# Patient Record
Sex: Female | Born: 1995 | Race: Black or African American | Hispanic: No | Marital: Single | Smoking: Never smoker
Health system: Southern US, Community
[De-identification: ages and names within clinical notes are randomized; demographics above are authoritative.]

## PROBLEM LIST (undated history)

## (undated) DIAGNOSIS — Z8616 Personal history of COVID-19: Secondary | ICD-10-CM

## (undated) DIAGNOSIS — L732 Hidradenitis suppurativa: Secondary | ICD-10-CM

## (undated) DIAGNOSIS — F419 Anxiety disorder, unspecified: Secondary | ICD-10-CM

## (undated) HISTORY — DX: Personal history of COVID-19: Z86.16

## (undated) HISTORY — DX: Hidradenitis suppurativa: L73.2

## (undated) HISTORY — PX: CYST EXCISION: SHX5701

---
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
## 2014-11-24 ENCOUNTER — Encounter: Payer: Self-pay | Admitting: General Surgery

## 2014-11-24 ENCOUNTER — Ambulatory Visit (INDEPENDENT_AMBULATORY_CARE_PROVIDER_SITE_OTHER): Payer: 59 | Admitting: General Surgery

## 2014-11-24 VITALS — BP 120/74 | HR 84 | Resp 14 | Ht 65.0 in | Wt 120.0 lb

## 2014-11-24 DIAGNOSIS — L732 Hidradenitis suppurativa: Secondary | ICD-10-CM

## 2014-11-24 NOTE — Addendum Note (Signed)
Addended by: Kieth BrightlySANKAR, SEEPLAPUTHUR G on: 11/24/2014 07:06 PM   Modules accepted: Orders

## 2014-11-24 NOTE — Progress Notes (Signed)
Patient ID: Nancy Brown, female   DOB: 12/12/1995, 19 y.o.   MRN: [id number] Patient is [facility] 12/04/14. She will pre admit by phone on 11/25/14. Patient is aware of date and instructions.

## 2014-11-24 NOTE — Progress Notes (Signed)
Patient ID: Nancy Brown, female   DOB: 06/03/1996, 19 y.o.   MRN: [id number]  Chief Complaint  Patient presents with  . Other    hidradenitis both axilla    HPI Nancy Brown is a 19 y.o. female who presents for an evaluation of hidradenitis of both axilla. This has been an issue for approximately 2-3 years. This problem comes and goes. She has recently been on antibiotics for this.   HPI  History reviewed. No pertinent past medical history.  Past Surgical History  Procedure Laterality Date  . Cyst excision Bilateral     side of ears    History reviewed. No pertinent family history.  Social History History  Substance Use Topics  . Smoking status: Never Smoker   . Smokeless tobacco: Never Used  . Alcohol Use: No    No Known Allergies  No current outpatient prescriptions on file.   No current facility-administered medications for this visit.    Review of Systems Review of Systems  Constitutional: Negative.   Respiratory: Negative.   Cardiovascular: Negative.     Blood pressure 120/74, pulse 84, resp. rate 14, height 5\' 5"  (1.651 m), weight 120 lb (54.432 kg), last menstrual period 11/24/2014.  Physical Exam Physical Exam  Constitutional: She is oriented to person, place, and time. She appears well-developed and well-nourished.  Cardiovascular: Normal rate, regular rhythm and normal heart sounds.   No murmur heard. Pulmonary/Chest: Effort normal and breath sounds normal.  Lymphadenopathy:    She has no cervical adenopathy.    She has no axillary adenopathy.  Neurological: She is alert and oriented to person, place, and time.  Skin: Skin is warm and dry.  7 x 5 area in the right axilla healed and active hidradenitis  7 x 5 area in the left axilla healed and active hidradenitis.    Data Reviewed Notes from urgent care  Assessment    Bilateral axillary hidradenitis. Best treated by excision. Discussed in full with pt and her mother. They are agreeable..       Plan    Surgery to be scheduled.        SANKAR,SEEPLAPUTHUR G 11/24/2014, 3:43 PM

## 2014-11-24 NOTE — Patient Instructions (Addendum)
The patient is aware to call back for any questions or concerns. Hidradenitis Suppurativa, Sweat Gland Abscess Hidradenitis suppurativa is a long lasting (chronic), uncommon disease of the sweat glands. With this, boil-like lumps and scarring develop in the groin, some times under the arms (axillae), and under the breasts. It may also uncommonly occur behind the ears, in the crease of the buttocks, and around the genitals.  CAUSES  The cause is from a blocking of the sweat glands. They then become infected. It may cause drainage and odor. It is not contagious. So it cannot be given to someone else. It most often shows up in puberty (about 10 to 20 years of age). But it may happen much later. It is similar to acne which is a disease of the sweat glands. This condition is slightly more common in African-Americans and women. SYMPTOMS   Hidradenitis usually starts as one or more red, tender, swellings in the groin or under the arms (axilla).  Over a period of hours to days the lesions get larger. They often open to the skin surface, draining clear to yellow-colored fluid.  The infected area heals with scarring. DIAGNOSIS  Your caregiver makes this diagnosis by looking at you. Sometimes cultures (growing germs on plates in the lab) may be taken. This is to see what germ (bacterium) is causing the infection.  TREATMENT   Topical germ killing medicine applied to the skin (antibiotics) are the treatment of choice. Antibiotics taken by mouth (systemic) are sometimes needed when the condition is getting worse or is severe.  Avoid tight-fitting clothing which traps moisture in.  Dirt does not cause hidradenitis and it is not caused by poor hygiene.  Involved areas should be cleaned daily using an antibacterial soap. Some patients find that the liquid form of Lever 2000, applied to the involved areas as a lotion after bathing, can help reduce the odor related to this condition.  Sometimes surgery is  needed to drain infected areas or remove scarred tissue. Removal of large amounts of tissue is used only in severe cases.  Birth control pills may be helpful.  Oral retinoids (vitamin A derivatives) for 6 to 12 months which are effective for acne may also help this condition.  Weight loss will improve but not cure hidradenitis. It is made worse by being overweight. But the condition is not caused by being overweight.  This condition is more common in people who have had acne.  It may become worse under stress. There is no medical cure for hidradenitis. It can be controlled, but not cured. The condition usually continues for years with periods of getting worse and getting better (remission). Document Released: 05/23/2004 Document Revised: 01/01/2012 Document Reviewed: 01/09/2014 Exit[employer] Patient Information 2015 [employer]. This information is not intended to replace advice given to you by your health care provider. Make sure you discuss any questions you have with your health care provider.  

## 2014-12-04 ENCOUNTER — Ambulatory Visit: Payer: Self-pay | Admitting: General Surgery

## 2014-12-04 DIAGNOSIS — L732 Hidradenitis suppurativa: Secondary | ICD-10-CM

## 2014-12-04 HISTORY — PX: AXILLARY HIDRADENITIS EXCISION: SUR522

## 2014-12-08 ENCOUNTER — Encounter: Payer: Self-pay | Admitting: General Surgery

## 2014-12-08 ENCOUNTER — Ambulatory Visit: Payer: 59 | Admitting: General Surgery

## 2014-12-08 ENCOUNTER — Ambulatory Visit (INDEPENDENT_AMBULATORY_CARE_PROVIDER_SITE_OTHER): Payer: 59 | Admitting: General Surgery

## 2014-12-08 VITALS — BP 110/70 | HR 82 | Resp 12 | Ht 65.0 in | Wt 118.0 lb

## 2014-12-08 DIAGNOSIS — L732 Hidradenitis suppurativa: Secondary | ICD-10-CM

## 2014-12-08 NOTE — Progress Notes (Signed)
Here today for postoperative visit, bilateral excision hidradenitis both axilla. Drain sheet present.  Both incision are intact. There is a stitch present in right axilla area that will need to be removed.  Drainage is still moderate. Right tube was milked and a long string of clot removed.  Follow up Thursday. Continue drain sheet.

## 2014-12-08 NOTE — Patient Instructions (Signed)
Follow up Thursday. Continue drain sheet.

## 2014-12-09 ENCOUNTER — Encounter: Payer: Self-pay | Admitting: General Surgery

## 2014-12-10 ENCOUNTER — Encounter: Payer: Self-pay | Admitting: General Surgery

## 2014-12-10 ENCOUNTER — Ambulatory Visit (INDEPENDENT_AMBULATORY_CARE_PROVIDER_SITE_OTHER): Payer: 59 | Admitting: General Surgery

## 2014-12-10 VITALS — BP 120/70 | HR 90 | Resp 12 | Ht 65.0 in | Wt 114.0 lb

## 2014-12-10 DIAGNOSIS — L732 Hidradenitis suppurativa: Secondary | ICD-10-CM

## 2014-12-10 NOTE — Progress Notes (Signed)
Here today for postoperative visit, bilateral excision hidradenitis both axilla. Drain sheet present. Drainage has been less than 10ml per day last 2 days Incisions healing well. Drains removed. Follow up with nurse on Tuesday for one suture removal at T site of incision and MD in 2 weeks.

## 2014-12-10 NOTE — Patient Instructions (Addendum)
The patient is aware to call back for any questions or concerns. Nurse Tuesday and MD in 2 weeks

## 2014-12-11 ENCOUNTER — Encounter: Payer: Self-pay | Admitting: General Surgery

## 2014-12-14 ENCOUNTER — Telehealth: Payer: Self-pay | Admitting: General Surgery

## 2014-12-14 NOTE — Telephone Encounter (Signed)
The patient's mother called reporting that one of the suture lines from the recent axillary hidradenitis surgery had opened. She had redress the wound. She is scheduled for a wound evaluation in the morning. A prescription for Ultram 50 mg, #30 with the inscription one-2 by mouth every 4-6 age when necessary for pain with 1 refill was sent to the pharmacy. I'll arrange to look at the wound tomorrow with the nurse.

## 2014-12-15 ENCOUNTER — Ambulatory Visit (INDEPENDENT_AMBULATORY_CARE_PROVIDER_SITE_OTHER): Payer: 59 | Admitting: General Surgery

## 2014-12-15 DIAGNOSIS — T8130XA Disruption of wound, unspecified, initial encounter: Secondary | ICD-10-CM

## 2014-12-15 DIAGNOSIS — L732 Hidradenitis suppurativa: Secondary | ICD-10-CM

## 2014-12-15 NOTE — Progress Notes (Signed)
Patient ID: Nancy Brown, female   DOB: 01/19/1996, 19 y.o.   MRN: [id number]  Nancy patient underwent excision of bilateral axillary hidradenitis on 12/04/2014. Her mother called last night reporting Nancy right axillary wound had disrupted and there was a small amount of drainage with some blood. She dress Nancy wound and Nancy Brown was seen today for assessment.  Examination shows moderate disruption of Nancy wound. Clean margins. Monocryl sutures removed. No evidence of loculated purulence or erythema.  Wound care was reviewed with both Nancy patient and her mother. She will follow-up as scheduled next week with Dr. Evette CristalSankar.

## 2014-12-16 DIAGNOSIS — T8130XA Disruption of wound, unspecified, initial encounter: Secondary | ICD-10-CM | POA: Insufficient documentation

## 2014-12-24 ENCOUNTER — Ambulatory Visit (INDEPENDENT_AMBULATORY_CARE_PROVIDER_SITE_OTHER): Payer: 59 | Admitting: General Surgery

## 2014-12-24 ENCOUNTER — Encounter: Payer: Self-pay | Admitting: General Surgery

## 2014-12-24 VITALS — BP 112/78 | HR 80 | Resp 12 | Ht 65.0 in | Wt 117.0 lb

## 2014-12-24 DIAGNOSIS — L732 Hidradenitis suppurativa: Secondary | ICD-10-CM

## 2014-12-24 NOTE — Progress Notes (Signed)
Here today for postoperative visit, bilateral excision hidradenitis both axilla on 12-04-14. States she is getting along better but that the right axilla is still numb.  Left axilla incision is well healed. The right axilla with open clean wound at the junction of the transverse vertical incision. The rest of the wound is well healed.  Triple antibiotic ointment and gauze daily. Follow up in 3 weeks.

## 2014-12-24 NOTE — Patient Instructions (Signed)
Triple antibiotic ointment and gauze daily. The patient is aware to call back for any questions or concerns.

## 2015-01-13 ENCOUNTER — Ambulatory Visit (INDEPENDENT_AMBULATORY_CARE_PROVIDER_SITE_OTHER): Payer: 59 | Admitting: General Surgery

## 2015-01-13 ENCOUNTER — Encounter: Payer: Self-pay | Admitting: General Surgery

## 2015-01-13 VITALS — BP 132/82 | HR 82 | Resp 14 | Ht 65.0 in | Wt 115.0 lb

## 2015-01-13 DIAGNOSIS — L732 Hidradenitis suppurativa: Secondary | ICD-10-CM

## 2015-01-13 NOTE — Patient Instructions (Addendum)
May use coco butter, scar fade or mederma cream. Patient will follow up as needed.

## 2015-01-13 NOTE — Progress Notes (Signed)
Here today for postoperative visit, bilateral excision hidradenitis both axilla on 12-04-14. States she is getting along much better but that the right axilla is still a little numb.  2 cm long very superficial open area in the right axilla. Area looks clean. Should heal in 2 weeks.  Patient will follow up as needed.

## 2015-02-15 LAB — SURGICAL PATHOLOGY

## 2015-02-21 NOTE — Op Note (Signed)
PATIENT NAME:  Nancy Brown, Nancy Brown MR#:  [id number] DATE OF BIRTH:  12/20/1995  DATE OF PROCEDURE:  12/04/2014  PREOPERATIVE DIAGNOSIS: Bilateral axillary hidradenitis.   POSTOPERATIVE DIAGNOSIS: Bilateral axillary hidradenitis.   OPERATION PERFORMED: Excision of hidradenitis, bilateral.   SURGEON: Kathreen CosierS. G. Siaosi Alter, M.D.   ANESTHESIA: General.   COMPLICATIONS: None.   ESTIMATED BLOOD LOSS: About 75 mL.   DRAINS: Blake drain, one on each side.   DESCRIPTION OF PROCEDURE: The patient was put to sleep in the supine position on the operating room table. The left axilla was operated on first. This area was prepped and draped out as a sterile field. Timeout was performed. The area involved went in an anterior to posterior direction in the mid portion of the axilla and measured about 7 x 5 cm. An elliptical excision was then performed of this entire area which was containing both the active and healed hidradenitis. The skin incision was deepened into the subcutaneous tissue and subsequently the involved skin was then excised out along the subcutaneous plane completely. Hemostasis was obtained with cautery. The skin was then elevated on both sides with the use of skin hooks to lift up the skin and subcutaneous tissue and cautery was used to mobilize the skin on both sides. Following this the subcutaneous tissue was approximated with interrupted 2-0 Vicryl stitches. A Blake drain was positioned and brought out through a stab incision inferiorly and fastened to the skin with a nylon stitch. The skin was then closed with a running 3-0 Monocryl stitch and then covered with LiquiBand. A small dressing was placed around the drain site. Following this the drapes were removed and the right axilla was operated on. This was again prepped and draped out as a sterile field. The area of involvement was more irregular on this side with a component that was more transverse and a portion that was going laterally into the upper arm.  This area was marked and subsequently excised out completely similar to what was done on the left. After this was done, the skin was elevated. Skin and subcutaneous tissue were elevated all along the open wound to allow for easy approximation. The approximation was done in a T fashion with the portion from the arm closed in a vertical fashion and the portion in the axillary fold in a transverse fashion from the anterior-posterior direction. After a drain was placed, similar to the left side, the subcutaneous tissue was approximated with interrupted 2-0 Vicryl stitches, and the skin was then closed with subcuticular 3-0 Monocryl with a single nylon stitch placed at the corner of a T junction. The area was covered with LiquiBand and a dressing placed around the drain. The patient subsequently was extubated and returned to the recovery room in stable condition.  ____________________________ S.Wynona LunaG. Tere Mcconaughey, MD sgs:sb D: 12/04/2014 12:21:17 ET T: 12/04/2014 14:20:55 ET JOB#: [id number]  cc: S.G. Evette CristalSankar, MD, <Dictator> [facility] Wynona LunaG Lyllian Gause MD ELECTRONICALLY SIGNED 12/07/2014 9:22

## 2015-09-20 ENCOUNTER — Ambulatory Visit (INDEPENDENT_AMBULATORY_CARE_PROVIDER_SITE_OTHER): Payer: 59 | Admitting: Family Medicine

## 2015-09-20 ENCOUNTER — Encounter: Payer: Self-pay | Admitting: Family Medicine

## 2015-09-20 VITALS — BP 119/78 | HR 76 | Temp 98.1°F | Resp 16 | Ht 65.0 in | Wt 115.8 lb

## 2015-09-20 DIAGNOSIS — M25441 Effusion, right hand: Secondary | ICD-10-CM | POA: Diagnosis not present

## 2015-09-20 LAB — CBC WITH DIFFERENTIAL/PLATELET
Basophils Absolute: 0 10*3/uL (ref 0.0–0.2)
Basos: 1 %
EOS (ABSOLUTE): 0.1 10*3/uL (ref 0.0–0.4)
Eos: 2 %
Hematocrit: 39.4 % (ref 34.0–46.6)
Hemoglobin: 13.4 g/dL (ref 11.1–15.9)
IMMATURE GRANS (ABS): 0 10*3/uL (ref 0.0–0.1)
IMMATURE GRANULOCYTES: 0 %
LYMPHS: 47 %
Lymphocytes Absolute: 2 10*3/uL (ref 0.7–3.1)
MCH: 28.6 pg (ref 26.6–33.0)
MCHC: 34 g/dL (ref 31.5–35.7)
MCV: 84 fL (ref 79–97)
Monocytes Absolute: 0.4 10*3/uL (ref 0.1–0.9)
Monocytes: 8 %
NEUTROS PCT: 42 %
Neutrophils Absolute: 1.8 10*3/uL (ref 1.4–7.0)
Platelets: 272 10*3/uL (ref 150–379)
RBC: 4.68 x10E6/uL (ref 3.77–5.28)
RDW: 13.4 % (ref 12.3–15.4)
WBC: 4.3 10*3/uL (ref 3.4–10.8)

## 2015-09-20 LAB — SEDIMENTATION RATE: SED RATE: 12 mm/h (ref 0–32)

## 2015-09-20 MED ORDER — MELOXICAM 15 MG PO TABS: 15.0000 mg | ORAL_TABLET | Freq: Every day | ORAL | Status: DC

## 2015-09-20 NOTE — Progress Notes (Signed)
Subjective:    Patient ID: Nancy Brown, female    DOB: 26-Mar-1996, 19 y.o.   MRN: [id number]  HPI: [name] is a 19 y.o. female presenting on 09/20/2015 for Hand Pain   HPI  Pt presents for R hand pain and swelling. No injury to hand. Symptoms began a few days ago (Sunday).  Hand is numb at fingertips and swollen to the wrist.  No bruising. No fevers at home. She had a similar episode in the L arm 2 months ago. Pain and burning from L shoulder to fingertips with swelling in the forearm from elbow down. She was at urgent care- was given a taper dose of prednisone.  Symptoms resolved but then developed in the R hand.  Pt reports she can barely bend the hand. History is significant for bilateral hidradenitis removal with drains in Feb of 2016. She has not had issues with hands up until 2 mos ago.  Past Medical History  Diagnosis Date  . Hidradenitis axillaris     surgically removed in 2016.    No current outpatient prescriptions on file prior to visit.   No current facility-administered medications on file prior to visit.    Review of Systems  Constitutional: Negative for fever, chills and fatigue.  Respiratory: Negative for chest tightness, shortness of breath and wheezing.   Cardiovascular: Negative for chest pain, palpitations and leg swelling.  Musculoskeletal: Positive for joint swelling and arthralgias.  Skin: Negative for color change, rash and wound.  Neurological: Positive for numbness (tips for R fingers). Negative for dizziness and weakness.  Hematological: Negative for adenopathy. Does not bruise/bleed easily.   Per HPI unless specifically indicated above     Objective:    BP 119/78 mmHg  Pulse 76  Temp(Src) 98.1 F (36.7 C) (Oral)  Resp 16  Ht '5\' 5"'  (1.651 m)  Wt 115 lb 12.8 oz (52.527 kg)  BMI 19.27 kg/m2  LMP 09/18/2015  Wt Readings from Last 3 Encounters:  09/20/15 115 lb 12.8 oz (52.527 kg)  01/13/15 115 lb (52.164 kg)  12/24/14 117 lb (53.071 kg)    Physical Exam  Constitutional: She appears well-developed and well-nourished. No distress.  HENT:  Head: Normocephalic and atraumatic.  Cardiovascular: Normal rate and regular rhythm.  Exam reveals no gallop and no friction rub.   No murmur heard. Pulmonary/Chest: Effort normal and breath sounds normal. She has no wheezes. She has no rales.  Musculoskeletal:       Right shoulder: Normal. She exhibits no tenderness and no swelling.       Left shoulder: Normal.       Right upper arm: Normal. She exhibits no tenderness, no bony tenderness, no swelling and no edema.       Left upper arm: Normal.       Right hand: She exhibits tenderness and swelling. She exhibits normal two-point discrimination, normal capillary refill and no deformity. Normal sensation noted. Decreased strength (grip limited due to pain) noted.       Left hand: Normal.  Lymphadenopathy:    She has no axillary adenopathy.  Skin: She is not diaphoretic.   Results for orders placed or performed in visit on 12/04/14  Surgical pathology  Result Value Ref Range   SURGICAL PATHOLOGY      Surgical Procedure CASE: [id number] PATIENT: Warda Dalesandro Surgical Pathology Report     SPECIMEN SUBMITTED: A. Hidradenitis of left axilla B. Hidradenitis of right axilla  CLINICAL HISTORY: None provided  PRE-OPERATIVE DIAGNOSIS: Bilateral hidradenitis  of axilla's  POST-OPERATIVE DIAGNOSIS: Excision of bilateral hidradenitis     DIAGNOSIS: A.  SKIN AND SOFT TISSUE OF AXILLA, LEFT; EXCISION: - HIDRADENITIS SUPPURATIVA WITH SINUS TRACT AND ABSCESS FORMATION. - NO NEOPLASIA SEEN.  B.  SKIN AND SOFT TISSUE OF AXILLA, RIGHT; EXCISION: - HIDRADENITIS SUPPURATIVA WITH SINUS TRACT AND ABSCESS FORMATION. - NO NEOPLASIA SEEN.    GROSS DESCRIPTION: A. Labeled: Hidradenitis of left axilla Tissue Fragment(s): 1 Measurement: 7.5 x 3.5 x 1.5 cm Comment: Roughly elliptical piece of dark brown skin and soft tissue. There are  several defects that range from 0.2-0.5 cm in diameter, extends into the underlying soft tissue. Cut surface reveals a cystic tract meas uring 0.8 cm in diameter, containing red soft material  Representative sections submitted in cassette(s): 1  B. Labeled: Hidradenitis of right axilla Tissue Fragment(s): 1 Measurement: 7.3 x 4.4 x 1.2 cm Comment: Irregular shaped piece of dark brown skin and soft tissue. There are several defects that range from 0.2-0.5 cm in diameter, extends into the underlying soft tissue. The cut surface reveals a cystic spaces measuring up to 0.9 cm in diameter, containing tan soft material.  Representative sections submitted in cassette(s): 1      Final Diagnosis performed by Hassan Buckler, MD.  Electronically signed 12/07/2014 10:31:30AM    The electronic signature indicates that the named Attending Pathologist has evaluated the specimen  Technical component performed at [facility], [address] [place], [place] [address] Lab: [phone] Dir: Darrick Penna. Evette Doffing, MD  Professional component performed at [facility], [facility], [place],  [place], [place] [address] Lab: [phone] Dir: Dellia Nims. Reuel Derby, MD        Assessment & Plan:   Problem List Items Addressed This Visit    None    Visit Diagnoses    Swelling of joint of hand, right    -  Primary    Possible reprecussion from surgery.  Given pain and swelling- r/o inflammatory arthritis, lupus, or other autoimmune. CBC, CMP, ESR, ANA. Meloxicam for pain and swelling. Consider rheumatology referral. RTC 2 weeks.     Relevant Orders    CBC with Differential    Antinuclear Antib (ANA)    Sed Rate (ESR)    Rheumatoid Factor    Comprehensive metabolic panel       Meds ordered this encounter  Medications  . meloxicam (MOBIC) 15 MG tablet    Sig: Take 1 tablet (15 mg total) by mouth daily.    Dispense:  20 tablet    Refill:  1    Order Specific Question:   Supervising Provider    Answer:  Arlis Porta [[id number]]      Follow up plan: Return in about 2 weeks (around 10/04/2015).

## 2015-09-20 NOTE — Patient Instructions (Signed)
I have given a strong anti-inflammatory and pain medication. Do not take any addition ibuprofen, aleve, aspirin with this medication.  We will check your lab work to determine what is causing your symptoms.  If your hand goes numb, cold, gets red, or very painful, please seek immediate medical attention.

## 2015-09-21 LAB — COMPREHENSIVE METABOLIC PANEL
A/G RATIO: 1.3 (ref 1.1–2.5)
ALT: 9 IU/L (ref 0–32)
AST: 16 IU/L (ref 0–40)
Albumin: 4.6 g/dL (ref 3.5–5.5)
Alkaline Phosphatase: 53 IU/L (ref 39–117)
BILIRUBIN TOTAL: 0.4 mg/dL (ref 0.0–1.2)
BUN / CREAT RATIO: 13 (ref 8–20)
BUN: 8 mg/dL (ref 6–20)
CALCIUM: 10 mg/dL (ref 8.7–10.2)
CHLORIDE: 101 mmol/L (ref 97–106)
CO2: 20 mmol/L (ref 18–29)
Creatinine, Ser: 0.64 mg/dL (ref 0.57–1.00)
GFR, EST AFRICAN AMERICAN: 149 mL/min/{1.73_m2} (ref >59)
GFR, EST NON AFRICAN AMERICAN: 129 mL/min/{1.73_m2} (ref >59)
GLOBULIN, TOTAL: 3.6 g/dL (ref 1.5–4.5)
Glucose: 79 mg/dL (ref 65–99)
POTASSIUM: 4.2 mmol/L (ref 3.5–5.2)
SODIUM: 141 mmol/L (ref 136–144)
TOTAL PROTEIN: 8.2 g/dL (ref 6.0–8.5)

## 2015-09-21 LAB — ANA: ANA: POSITIVE — AB

## 2015-09-21 LAB — RHEUMATOID FACTOR: Rhuematoid fact SerPl-aCnc: <10 IU/mL (ref 0.0–13.9)

## 2015-09-22 ENCOUNTER — Telehealth: Payer: Self-pay | Admitting: Family Medicine

## 2015-09-22 DIAGNOSIS — R768 Other specified abnormal immunological findings in serum: Secondary | ICD-10-CM

## 2015-09-22 NOTE — Telephone Encounter (Signed)
Called pt to review her lab results. ANA was positive.  We will have her evaluated by rheumatology to determine the cause.

## 2015-10-05 ENCOUNTER — Telehealth: Payer: Self-pay | Admitting: Family Medicine

## 2015-10-05 NOTE — Telephone Encounter (Signed)
R/T call to patient and let her know she needs to [facility] to schedule her appt. They have left her 2 msgs.

## 2015-10-05 NOTE — Telephone Encounter (Signed)
[facility],  She asked if that was about her referral to rheumatology.  Please call [phone]

## 2015-10-21 DIAGNOSIS — R768 Other specified abnormal immunological findings in serum: Secondary | ICD-10-CM | POA: Insufficient documentation

## 2015-10-21 DIAGNOSIS — M79643 Pain in unspecified hand: Secondary | ICD-10-CM | POA: Insufficient documentation

## 2015-11-04 DIAGNOSIS — R768 Other specified abnormal immunological findings in serum: Secondary | ICD-10-CM | POA: Insufficient documentation

## 2016-03-10 ENCOUNTER — Other Ambulatory Visit
Admission: RE | Admit: 2016-03-10 | Discharge: 2016-03-10 | Disposition: A | Discharge status: Home / Self Care | Payer: 59 | Source: Ambulatory Visit | Attending: Family Medicine | Admitting: Family Medicine

## 2016-03-10 ENCOUNTER — Ambulatory Visit (INDEPENDENT_AMBULATORY_CARE_PROVIDER_SITE_OTHER): Payer: 59 | Admitting: Family Medicine

## 2016-03-10 ENCOUNTER — Encounter: Payer: Self-pay | Admitting: Family Medicine

## 2016-03-10 VITALS — BP 118/80 | HR 73 | Temp 98.1°F | Resp 16 | Ht 65.0 in | Wt 114.0 lb

## 2016-03-10 DIAGNOSIS — R0789 Other chest pain: Secondary | ICD-10-CM | POA: Diagnosis not present

## 2016-03-10 DIAGNOSIS — M79605 Pain in left leg: Secondary | ICD-10-CM

## 2016-03-10 DIAGNOSIS — R079 Chest pain, unspecified: Secondary | ICD-10-CM | POA: Diagnosis present

## 2016-03-10 DIAGNOSIS — M25562 Pain in left knee: Secondary | ICD-10-CM

## 2016-03-10 LAB — FIBRIN DERIVATIVES D-DIMER (ARMC ONLY): FIBRIN DERIVATIVES D-DIMER (ARMC): 231 (ref 0–499)

## 2016-03-10 MED ORDER — NAPROXEN 500 MG PO TABS: 500.0000 mg | ORAL_TABLET | Freq: Two times a day (BID) | ORAL | Status: DC

## 2016-03-10 NOTE — Progress Notes (Addendum)
Subjective:    Patient ID: Nancy Brown, female    DOB: 1996-05-03, 20 y.o.   MRN: [id number]  HPI: [name] is a 20 y.o. female presenting on 03/10/2016 for Leg Pain   HPI  Pt presents for leg pain- was sent home from work last week with chest pain, elevated BP and L leg pain. Sent home last Thursday. Did not go to ER. Took ibuprofen andpain went away.  Pain returned last night. Located above knee to calf on lateral side of anterior knee.  No chest pain or shortness of breath. Leg around the knee feels painful and tight. Most painful with flexion and full extension of the knee. No recent travel.  Only hurts when she walks on lays down. No running or injury to leg. No swelling.   Past Medical History  Diagnosis Date  . Hidradenitis axillaris     surgically removed in 2016.    No current outpatient prescriptions on file prior to visit.   No current facility-administered medications on file prior to visit.    Review of Systems  Constitutional: Negative for fever and chills.  HENT: Negative.   Respiratory: Negative for cough, chest tightness and wheezing.   Cardiovascular: Positive for chest pain. Negative for leg swelling.  Gastrointestinal: Negative for nausea, vomiting, abdominal pain, diarrhea and constipation.  Endocrine: Negative.  Negative for cold intolerance, heat intolerance, polydipsia, polyphagia and polyuria.  Genitourinary: Negative for dysuria and difficulty urinating.  Musculoskeletal: Positive for arthralgias (L knee). Negative for myalgias.  Neurological: Negative for dizziness, light-headedness and numbness.  Psychiatric/Behavioral: Negative.    Per HPI unless specifically indicated above     Objective:    BP 118/80 mmHg  Pulse 73  Temp(Src) 98.1 F (36.7 C) (Oral)  Resp 16  Ht '5\' 5"'  (1.651 m)  Wt 114 lb (51.71 kg)  BMI 18.97 kg/m2  LMP 10/24/2015  Wt Readings from Last 3 Encounters:  03/10/16 114 lb (51.71 kg)  09/20/15 115 lb 12.8 oz (52.527 kg)    01/13/15 115 lb (52.164 kg)    Physical Exam  Constitutional: She is oriented to person, place, and time. She appears well-developed and well-nourished.  HENT:  Head: Normocephalic and atraumatic.  Neck: Neck supple.  Cardiovascular: Normal rate, regular rhythm and normal heart sounds.  Exam reveals no gallop and no friction rub.   No murmur heard. Pulmonary/Chest: Effort normal and breath sounds normal. She has no wheezes. She exhibits no tenderness.  Abdominal: Soft. Normal appearance and bowel sounds are normal. She exhibits no distension and no mass. There is no tenderness. There is no rebound and no guarding.  Musculoskeletal: Normal range of motion. She exhibits no edema.       Left knee: She exhibits normal range of motion, no swelling, no effusion, no erythema, normal alignment, no LCL laxity, normal meniscus and no MCL laxity. Tenderness found. Lateral joint line tenderness noted.  Negative homan's sign bilateral. Calf circumference 13.5in bilateral.  Pain with flexion and full extension L knee. Pain along lateral joint line.   Lymphadenopathy:    She has no cervical adenopathy.  Neurological: She is alert and oriented to person, place, and time.  Skin: Skin is warm and dry.  Psychiatric: She has a normal mood and affect. Her behavior is normal. Judgment and thought content normal.   Results for orders placed or performed in visit on 09/20/15  CBC with Differential  Result Value Ref Range   WBC 4.3 3.4 - 10.8 x10E3/uL  RBC 4.68 3.77 - 5.28 x10E6/uL   Hemoglobin 13.4 11.1 - 15.9 g/dL   Hematocrit 39.4 34.0 - 46.6 %   MCV 84 79 - 97 fL   MCH 28.6 26.6 - 33.0 pg   MCHC 34.0 31.5 - 35.7 g/dL   RDW 13.4 12.3 - 15.4 %   Platelets 272 150 - 379 x10E3/uL   Neutrophils 42 %   Lymphs 47 %   Monocytes 8 %   Eos 2 %   Basos 1 %   Neutrophils Absolute 1.8 1.4 - 7.0 x10E3/uL   Lymphocytes Absolute 2.0 0.7 - 3.1 x10E3/uL   Monocytes Absolute 0.4 0.1 - 0.9 x10E3/uL   EOS  (ABSOLUTE) 0.1 0.0 - 0.4 x10E3/uL   Basophils Absolute 0.0 0.0 - 0.2 x10E3/uL   Immature Granulocytes 0 %   Immature Grans (Abs) 0.0 0.0 - 0.1 x10E3/uL  Antinuclear Antib (ANA)  Result Value Ref Range   Anit Nuclear Antibody(ANA) Positive (A) Negative  Sed Rate (ESR)  Result Value Ref Range   Sed Rate 12 0 - 32 mm/hr  Rheumatoid Factor  Result Value Ref Range   Rhuematoid fact SerPl-aCnc <10.0 0.0 - 13.9 IU/mL  Comprehensive metabolic panel  Result Value Ref Range   Glucose 79 65 - 99 mg/dL   BUN 8 6 - 20 mg/dL   Creatinine, Ser 0.64 0.57 - 1.00 mg/dL   GFR calc non Af Amer 129 >59 mL/min/1.73   GFR calc Af Amer 149 >59 mL/min/1.73   BUN/Creatinine Ratio 13 8 - 20   Sodium 141 136 - 144 mmol/L   Potassium 4.2 3.5 - 5.2 mmol/L   Chloride 101 97 - 106 mmol/L   CO2 20 18 - 29 mmol/L   Calcium 10.0 8.7 - 10.2 mg/dL   Total Protein 8.2 6.0 - 8.5 g/dL   Albumin 4.6 3.5 - 5.5 g/dL   Globulin, Total 3.6 1.5 - 4.5 g/dL   Albumin/Globulin Ratio 1.3 1.1 - 2.5   Bilirubin Total 0.4 0.0 - 1.2 mg/dL   Alkaline Phosphatase 53 39 - 117 IU/L   AST 16 0 - 40 IU/L   ALT 9 0 - 32 IU/L      Assessment & Plan:   Problem List Items Addressed This Visit    None    Visit Diagnoses    Lateral knee pain, left    -  Primary    Leg pain is likely MSK due to anterior knee pain. Trial of naproxen BID. Consider PT or ortho if symptoms not improved.     Relevant Medications    naproxen (NAPROSYN) 500 MG tablet    Left leg pain        Pain is on the anterior surface of the L. D dimer negative- rule out clot. No swelling. Likely referred from knee pain. Alarm symptoms reviewed.    Relevant Orders    Fibrin derivatives D-Dimer ([facility] only)    Chest pressure        ECG WNL. D dimer negative- low suspicion VTE. No current chest pain. Will monitor. Pt to ER if chest pain returns for immediate evaluation. Suspect anxiety related to leg pain and elevated BP.     Relevant Orders    EKG 12-Lead    Fibrin  derivatives D-Dimer ([facility] only)       Meds ordered this encounter  Medications  . MedroxyPROGESTERone Acetate 150 MG/ML SUSY    Sig:   . naproxen (NAPROSYN) 500 MG tablet    Sig: Take  1 tablet (500 mg total) by mouth 2 (two) times daily with a meal.    Dispense:  30 tablet    Refill:  1    Order Specific Question:  Supervising Provider    Answer:  Arlis Porta [[id number]]      Follow up plan: Return in about 2 weeks (around 03/24/2016) for knee pain.

## 2016-03-10 NOTE — Patient Instructions (Addendum)
L knee pain- Try taking naproxen twice daily to help with your knee. I think the majority of your pain is knee related. We will get some labwork to rule out a clot in the leg. If it is positive we will do an ultrasound.   I think your chest pressure may have been anxiety related. However- if you experience chest pain, shortness of breath, leg swelling or increased pain in the calf, please go to the ER.

## 2016-03-24 ENCOUNTER — Ambulatory Visit: Payer: 59 | Admitting: Family Medicine

## 2016-11-27 ENCOUNTER — Encounter: Payer: Self-pay | Admitting: Family Medicine

## 2016-11-27 ENCOUNTER — Ambulatory Visit (INDEPENDENT_AMBULATORY_CARE_PROVIDER_SITE_OTHER): Payer: 59 | Admitting: Family Medicine

## 2016-11-27 VITALS — BP 121/78 | HR 86 | Temp 99.6°F | Resp 16 | Ht 65.0 in | Wt 128.0 lb

## 2016-11-27 DIAGNOSIS — A084 Viral intestinal infection, unspecified: Secondary | ICD-10-CM

## 2016-11-27 MED ORDER — ONDANSETRON 4 MG PO TBDP: 4.0000 mg | ORAL_TABLET | Freq: Three times a day (TID) | ORAL | 0 refills | Status: DC | PRN

## 2016-11-27 NOTE — Patient Instructions (Signed)
Thank you for coming in to clinic today.  1.  I think this is a Viral Gastroenteritis - stomach bug. This will pass within 1 week, maybe less. - Important to continue hydration, ginger ale is fine, otherwise may start adding some Pedialyte or G2 Gatorade or Apple Juice mixed with water (equal parts, 1:1 mixture). Sometimes small sips is better than larger amounts at once, can try tablespoon every few minutes. - Given rx for Zofran ODT 4mg  dissolving tablets - may use every 8 hours or 3 times a day as needed for vomiting  If symptoms are worsening, persistent fevers >101F >3 days, abdominal pain, worsening or increased vomiting, persistent diarrhea after 5-7 days, decreased appetite, please call or return to clinic, may go to Urgent Care or Pediatic ED  Please schedule a follow-up appointment with Dr. Althea CharonKaramalegos in 1-2 weeks if not improved viral gastro  If you have any other questions or concerns, please feel free to call the clinic or send a message through MyChart. You may also schedule an earlier appointment if necessary.  Saralyn PilarAlexander Foy Mungia, DO [facility] [place]

## 2016-11-27 NOTE — Progress Notes (Signed)
Subjective:    Patient ID: Nancy Brown, female    DOB: 02-Aug-1996, 21 y.o.   MRN: [id number]  Nancy Brown is a 21 y.o. female presenting on 11/27/2016 for Diarrhea (nuaea and HA onset yesterday )  Patient presents for a same day appointment.  HPI   SUSPECETD VIRAL GASTROENTERITIS / NAUSEA/VOMITING / DIARRHEA Reports symptoms started around 5pm yesterday with diarrhea (loose stools but not watery) x 3 yesterday and x 1 today, then worsening to nausea and vomiting x1 (non bloody and non bilious) that evening, and did have repeat episode of diarrhea this morning. Today feels nauseas constantly but somewhat improved, tolerating some fluids with ginger ale, but food seems to make her more nauseas. 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 Smoking status: Never Smoker  . Smokeless tobacco: Never Used  . Alcohol use No    Review of Systems Per HPI unless specifically indicated above     Objective:    BP 121/78   Pulse 86   Temp 99.6 F (37.6 C) (Oral)   Resp 16   Ht 5\' 5"  (1.651 m)   Wt 128 lb (58.1 kg)   BMI 21.30 kg/m   Wt Readings from Last 3 Encounters:  11/27/16 128 lb (58.1 kg)  03/10/16 114 lb (51.7 kg)  09/20/15 115 lb 12.8 oz (52.5 kg)    Physical Exam  Constitutional: She appears well-developed and well-nourished. No distress.  Well-appearing, comfortable, cooperative  HENT:  Head: Normocephalic and atraumatic.  Mouth/Throat: Oropharynx is clear and moist.  Neck: Normal range of motion. Neck supple.    Cardiovascular: Normal rate.   Pulmonary/Chest: Effort normal and breath sounds normal. No respiratory distress. She has no wheezes. She has no rales.  Abdominal: Soft. Bowel sounds are normal. She exhibits no distension and no mass. There is no tenderness. There is no rebound and no guarding.  Negative Mcburney's RLQ.  Musculoskeletal: She exhibits no edema.  Neurological: She is alert.  Skin: Skin is warm and dry. No rash noted. She is not diaphoretic. No erythema.  Psychiatric: Her behavior is normal.  Nursing note and vitals reviewed.      Assessment & Plan:   Problem List Items Addressed This Visit    None    Visit Diagnoses    Viral gastroenteritis    -  Primary  Consistent with viral gastroenteritis, x 24 hour symptoms diarrhea / vomiting. Known +sick contact with mother same symptoms, no clear etiology of food poisoning, given symptoms onset before shared meal. No other recent illness symptoms - Currently well appearing and non-toxic, clinically well hydrated on exam, benign abdomen - On Depo provera contraception, no missed doses, has regular spotting, denies any sexual activity and declines offered pregnancy test  Plan: 1. Reassurance, likely self-limited 7-10 days 2. Continue PO as tolerated. May try to increase hydration, clear fluids (gatorade, pedialyate, water + apple juice) 3. Rx Zofran 4mg  ODT q 8 hr PRN for nausea 4. May try OTC Pepto-bismol / Imodium if needed 5. Return criteria reviewed  Note for  work out today, return Thursday (as regularly scheduled), already off Tues/Weds     Relevant Medications   ondansetron (ZOFRAN ODT) 4 MG disintegrating tablet      Meds ordered this encounter  Medications  . ondansetron (ZOFRAN ODT) 4 MG disintegrating tablet    Sig: Take 1 tablet (4 mg total) by mouth every 8 (eight) hours as needed for nausea or vomiting.    Dispense:  20 tablet    Refill:  0      Follow up plan: Return in about 2 weeks (around  12/11/2016), or if symptoms worsen or fail to improve, for viral gastroenteritis.  Saralyn PilarAlexander Karamalegos, DO [facility] 11/27/2016, 3:02 PM

## 2018-03-08 ENCOUNTER — Encounter: Payer: Self-pay | Admitting: Family Medicine

## 2018-03-08 ENCOUNTER — Ambulatory Visit (INDEPENDENT_AMBULATORY_CARE_PROVIDER_SITE_OTHER): Payer: 59 | Admitting: Family Medicine

## 2018-03-08 VITALS — BP 132/87 | HR 99 | Temp 99.1°F | Resp 16 | Ht 65.0 in | Wt 133.6 lb

## 2018-03-08 DIAGNOSIS — R519 Headache, unspecified: Secondary | ICD-10-CM

## 2018-03-08 DIAGNOSIS — R51 Headache: Secondary | ICD-10-CM | POA: Diagnosis not present

## 2018-03-08 DIAGNOSIS — R030 Elevated blood-pressure reading, without diagnosis of hypertension: Secondary | ICD-10-CM

## 2018-03-08 DIAGNOSIS — R55 Syncope and collapse: Secondary | ICD-10-CM | POA: Diagnosis not present

## 2018-03-08 NOTE — Progress Notes (Signed)
Subjective:    Patient ID: Nancy Brown, female    DOB: Mar 09, 1996, 22 y.o.   MRN: [id number]  Nancy Brown is a 22 y.o. female presenting on 03/08/2018 for Back Pain (HA when checked B/P this morning at work site it was 150/94 and pulse 118, felt Dizzy and passing out this morning)  Patient presents for a same day appointment. Her last visit to our office was over 1 year ago for acute GI illness.  HPI   ACUTE HEADACHE / ELEVATED BP / NEAR SYNCOPE Reports symptoms started this morning, she felt fine initially this morning, and then after got to work, she felt she had "pounding in head" and headache and then her "back was also hearing", and she felt "pressure in both legs", she states BP was usually fine and never elevated and she had acutely elevated BP 150/87 and pulse 118 and felt palpitations or heart racing,  - She felt dizzy and sat down on a resident's bed, and felt like she was going to pass out but didn't, had near syncope episode - Feels heaviness in both legs, without numbness or tingling or weakness - Admits she felt normal this morning and ate breakfast did not miss any meals, was not dehydrated - She has never had headaches or migraine before. Her mother has history of migraine headaches in past - Denies acute chest pain or tightness, dyspnea, cough, sinus pain or pressure congestion, ear pain, rash, neck stiffness, nausea vomiting abdominal pain, heavy menstrual cycle or bleeding (on Depo Provera), head injury or trauma  Additional PMH - >3 years ago she was seen by Dr Corey Harold Rheumatology due to elevated ANA lab, otherwise she had hand pain and swelling intermittent, was non specific work up at that time, thought to have a synovitis, but thought to have FALSE positive ANA.   Depression screen PHQ 2/9 09/20/2015  Decreased Interest 0  Down, Depressed, Hopeless 0  PHQ - 2 Score 0    Social History   Tobacco Use  . Smoking status: Never Smoker  . Smokeless  tobacco: Never Used  Substance Use Topics  . Alcohol use: No  . Drug use: No    Review of Systems Per HPI unless specifically indicated above     Objective:    BP 132/87   Pulse 99   Temp 99.1 F (37.3 C) (Oral)   Resp 16   Ht  (1.651 m)   Wt 133 lb 9.6 oz (60.6 kg)   SpO2 100%   BMI 22.23 kg/m   Wt Readings from Last 3 Encounters:  03/08/18 133 lb 9.6 oz (60.6 kg)  11/27/16 128 lb (58.1 kg)  03/10/16 114 lb (51.7 kg)    Physical Exam  Constitutional: She is oriented to person, place, and time. She appears well-developed and well-nourished. No distress.  Not well appearing, slightly uncomfortable with headache, cooperative  HENT:  Head: Normocephalic and atraumatic.  Mouth/Throat: Oropharynx is clear and moist.  Frontal / maxillary sinuses non-tender. Nares patent without purulence or edema. Bilateral TMs clear without erythema, effusion or bulging. Oropharynx clear without erythema, exudates, edema or asymmetry.  Photosensitivity on exam  Eyes: Conjunctivae are normal. Right eye exhibits no discharge. Left eye exhibits no discharge.  Neck: Normal range of motion. Neck supple.  Cardiovascular: Regular rhythm, normal heart sounds and intact distal pulses.  No murmur heard. Tachycardic  Pulmonary/Chest: Effort normal and breath sounds normal. No respiratory distress. She has no wheezes. She has no rales.  Musculoskeletal: Normal range of motion. She exhibits no edema.  Muscle strength intact 5/5 b/l extremities  Calves and lower leg appear symmetrical, mild tender lower leg R>L, but no erythema or edema.  Lymphadenopathy:    She has no cervical adenopathy.  Neurological: She is alert and oriented to person, place, and time. No cranial nerve deficit or sensory deficit. She exhibits normal muscle tone. Coordination normal.  Distal intact sensation  Skin: Skin is warm and dry. No rash noted. She is not diaphoretic. No erythema.  Psychiatric: She has a normal mood  and affect. Her behavior is normal.  Well groomed, good eye contact, normal speech and thoughts. Not anxious appearing.  Nursing note and vitals reviewed.      Assessment & Plan:   Problem List Items Addressed This Visit    None    Visit Diagnoses    Acute nonintractable headache, unspecified headache type    -  Primary   Near syncope       Elevated BP without diagnosis of hypertension          Still acutely ill today with elevated BP, somewhat improved in office, and tachycardia but also improved. 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, however question if this could be related - Also on Depo Provera without heavy periods, but question if any lab abnormality anemia or electrolyte disturbance could be present - Most likely differential is acute migraine, given her symptoms and persistent headache Does not appear dehydrated, and she is not hemodynamically unstable  Plan Discussion on differential diagnosis is not clear - if concern for acute migraine, do not feel comfortable with abortive therapy at this time given all of her other symptoms at time of onset - especially leading into weekend with limited follow-up until next week - Recommend she needs lab work up more promptly to rule out other causes of her symptoms - May benefit from other testing possible EKG and imaging. Prior history of False ANA - may need repeat rheum labs or possibly rule out DVT/PE - given her lower extremity symptoms but exam is  not actually consistent with DVT - Well's Score is 0 - Agree to go to [facility] ED - called hospital triage reviewed case, she will go by Personal Vehicle w/ Father driving her right now - Follow-up next week as planned  No orders of the defined types were placed in this encounter.   Follow up plan: Return in about 1 week (around 03/15/2018), or if symptoms worsen or fail to improve.  Saralyn Pilar, DO [facility] 03/08/2018, 10:21 AM

## 2018-03-08 NOTE — Patient Instructions (Addendum)
Thank you for coming to the office today.  Please go straight to [facility] Emergency Department - I will call them now to let them know of your arrival  Need blood tests and other work up - may have migraine headache or elevated BP for other reason, need to check chemistry and make sure not anemic  Please schedule a Follow-up Appointment to: Return in about 1 week (around 03/15/2018), or if symptoms worsen or fail to improve.  If you have any other questions or concerns, please feel free to call the office or send a message through MyChart. You may also schedule an earlier appointment if necessary.  Additionally, you may be receiving a survey about your experience at our office within a few days to 1 week by e-mail or mail. We value your feedback.  Saralyn Pilar, DO [facility], [place]

## 2018-09-18 ENCOUNTER — Emergency Department
Admission: EM | Admit: 2018-09-18 | Discharge: 2018-09-18 | Disposition: A | Discharge status: Home / Self Care | Payer: BLUE CROSS/BLUE SHIELD | Attending: Emergency Medicine | Admitting: Emergency Medicine

## 2018-09-18 ENCOUNTER — Emergency Department: Payer: BLUE CROSS/BLUE SHIELD

## 2018-09-18 ENCOUNTER — Other Ambulatory Visit: Payer: Self-pay

## 2018-09-18 DIAGNOSIS — R0789 Other chest pain: Secondary | ICD-10-CM

## 2018-09-18 LAB — CBC
HCT: 37.4 % (ref 36.0–46.0)
HEMOGLOBIN: 12.2 g/dL (ref 12.0–15.0)
MCH: 28 pg (ref 26.0–34.0)
MCHC: 32.6 g/dL (ref 30.0–36.0)
MCV: 85.8 fL (ref 80.0–100.0)
NRBC: 0 % (ref 0.0–0.2)
Platelets: 269 10*3/uL (ref 150–400)
RBC: 4.36 MIL/uL (ref 3.87–5.11)
RDW: 12.9 % (ref 11.5–15.5)
WBC: 4.7 10*3/uL (ref 4.0–10.5)

## 2018-09-18 LAB — COMPREHENSIVE METABOLIC PANEL
ALBUMIN: 4.1 g/dL (ref 3.5–5.0)
ALK PHOS: 45 U/L (ref 38–126)
ALT: 12 U/L (ref 0–44)
AST: 20 U/L (ref 15–41)
Anion gap: 8 (ref 5–15)
BILIRUBIN TOTAL: 0.6 mg/dL (ref 0.3–1.2)
BUN: 16 mg/dL (ref 6–20)
CALCIUM: 9.1 mg/dL (ref 8.9–10.3)
CO2: 26 mmol/L (ref 22–32)
CREATININE: 0.7 mg/dL (ref 0.44–1.00)
Chloride: 107 mmol/L (ref 98–111)
GFR calc Af Amer: >60 mL/min (ref >60)
GFR calc non Af Amer: >60 mL/min (ref >60)
GLUCOSE: 105 mg/dL — AB (ref 70–99)
Potassium: 3.3 mmol/L — ABNORMAL LOW (ref 3.5–5.1)
SODIUM: 141 mmol/L (ref 135–145)
TOTAL PROTEIN: 7.8 g/dL (ref 6.5–8.1)

## 2018-09-18 LAB — TROPONIN I: Troponin I: <0.03 ng/mL (ref <0.03)

## 2018-09-18 LAB — FIBRIN DERIVATIVES D-DIMER (ARMC ONLY): Fibrin derivatives D-dimer (ARMC): 181.85 ng/mL (FEU) (ref 0.00–499.00)

## 2018-09-18 MED ORDER — IBUPROFEN 600 MG PO TABS: 600.0000 mg | ORAL_TABLET | Freq: Four times a day (QID) | ORAL | 0 refills | Status: DC | PRN

## 2018-09-18 NOTE — ED Notes (Signed)
Patient verbalized understanding of discharge instructions, no questions. Patient ambulated out of ED with steady gait in no distress.  

## 2018-09-18 NOTE — ED Provider Notes (Signed)
[facility] Emergency Department Provider Note  Time seen: 3:53 PM  I have reviewed the triage vital signs and the nursing notes.   HISTORY  Chief Complaint Chest Pain and Shortness of Breath    HPI Nancy Brown is a 22 y.o. female with no significant past medical history who presents to the emergency department for chest pain.  According to the patient she was at work when she began experiencing chest pain a little over an hour ago.  Patient states the pain is worse with movement of her chest or deep inspiration.  States she is only had chest pain one time previously and that was due to a "panic attack."  Patient denies any nausea or diaphoresis although states she was feeling somewhat short of breath when the chest pain started.  Describes the pain as mild to moderate currently, worse with movement or deep inspiration.  Aching type pain.  No leg pain, patient does state intermittent leg swelling but that is a chronic issue no significant swelling currently.  No personal history of DVT/blood clot.  Patient is on Depo-Provera.   Past Medical History:  Diagnosis Date  . Hidradenitis axillaris    surgically removed in 2016.    Patient Active Problem List   Diagnosis Date Noted  . 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 ANA positive 10/21/2015  . Hand discomfort 10/21/2015  . Disruption of wound 12/16/2014    Past Surgical History:  Procedure Laterality Date  . AXILLARY HIDRADENITIS EXCISION Bilateral 12-04-14  . CYST EXCISION Bilateral    side of ears    Prior to Admission medications   Medication Sig Start Date End Date Taking? Authorizing Provider  MedroxyPROGESTERone Acetate 150 MG/ML SUSY  01/30/16   [provider]    No Known Allergies  Family History  Problem Relation Age of Onset  . Hypertension Mother   . Asthma Mother   . Thyroid disease Mother   . Hyperlipidemia Mother     Social History Social History   Tobacco  Use  . Smoking status: Never Smoker  . Smokeless tobacco: Never Used  Substance Use Topics  . Alcohol use: No  . Drug use: No    Review of Systems Constitutional: Negative for fever. Cardiovascular: Positive for central chest pain Respiratory: Shortness of breath, now resolved. Gastrointestinal: Negative for abdominal pain, vomiting  Genitourinary: Unknown last menstrual period, irregular per patient. Musculoskeletal: Negative for leg pain. Skin: Negative for skin complaints  Neurological: Negative for headache All other ROS negative  ____________________________________________   PHYSICAL EXAM:  VITAL SIGNS: ED Triage Vitals [09/18/18 1546]  Enc Vitals Group     BP 127/85     Pulse Rate (!) 105     Resp 17     Temp (!) 97.4 F (36.3 C)     Temp Source Oral     SpO2 97 %     Weight      Height      Head Circumference      Peak Flow      Pain Score      Pain Loc      Pain Edu?      Excl. in GC?    Constitutional: Alert and oriented. Well appearing and in no distress. Eyes: Normal exam ENT   Head: Normocephalic and atraumatic.   Mouth/Throat: Mucous membranes are moist. Cardiovascular: Normal rate, regular rhythm around 100 bpm.  No murmurs. Respiratory: Normal respiratory effort without tachypnea nor retractions. Breath sounds  are clear  Gastrointestinal: Soft and nontender. No distention.   Musculoskeletal: Nontender with normal range of motion in all extremities. No lower extremity tenderness or edema.  Sternum is very tender to palpation. Neurologic:  Normal speech and language. No gross focal neurologic deficits  Skin:  Skin is warm, dry and intact.  Psychiatric: Mood and affect are normal.   ____________________________________________    EKG  EKG reviewed and interpreted by myself shows a normal sinus rhythm at 96 bpm with a narrow QRS, normal axis, normal intervals, nonspecific ST changes.  ____________________________________________     RADIOLOGY  Chest x-ray is negative  ____________________________________________   INITIAL IMPRESSION / ASSESSMENT AND PLAN / ED COURSE  Pertinent labs & imaging results that were available during my care of the patient were reviewed by me and considered in my medical decision making (see chart for details).  Patient presents to the emergency department for chest pain beginning approximately 1 hour ago, somewhat worse with movement and deep inspiration.  Chest pain is extremely reproducible on exam with chest palpation.  Given her pleuritic description however we will obtain a d-dimer as well as basic lab work including a troponin,  EKG and chest x-ray.  Differential this time would include chest wall pain, ACS, PE, pneumonia, pneumothorax.  Patient's work-up is essentially negative.  Negative troponin.  Negative d-dimer.  Negative chest x-ray, no concerning findings on EKG.  Overall the patient appears extremely well with a very reproducible chest pain.  Highly suspect chest wall pain to be the patient source of discomfort.  We will discharged with ibuprofen medication PCP follow-up in my normal chest pain return precautions.  Patient agreeable to plan of care.  ____________________________________________   FINAL CLINICAL IMPRESSION(S) / ED DIAGNOSES  Chest wall pain    Minna Antis, MD 09/18/18 1655

## 2018-09-18 NOTE — ED Triage Notes (Signed)
Patient arrives with c/o chest pain that started 1 hour ago. Patient states pain started at rest and is on the right sided radiating to neck. Hx anxiety, stated seen in ED in 2010 with similar episode. +sob. EMS administered 324 ASA. 20 g inserted into left ac.

## 2019-10-06 ENCOUNTER — Other Ambulatory Visit: Payer: Self-pay

## 2019-10-16 ENCOUNTER — Encounter: Payer: 59 | Admitting: Obstetrics & Gynecology

## 2019-12-25 ENCOUNTER — Telehealth: Payer: Self-pay | Admitting: Family Medicine

## 2019-12-25 NOTE — Telephone Encounter (Signed)
Pt wanted to know if you would write a letter for mask . That she was allergic to mask. Pt call back# is  [phone]

## 2019-12-25 NOTE — Telephone Encounter (Signed)
Called patient. Spoke to her, new letter written for her, it is printed and signed, in my outbox, ready to be faxed to her work at  TO: [facility] ATTN Riley Kill Fax: [phone]  Saralyn Pilar, DO [facility] 12/25/2019, 12:42 PM

## 2020-05-10 ENCOUNTER — Other Ambulatory Visit: Payer: Self-pay

## 2020-05-10 ENCOUNTER — Emergency Department: Payer: 59

## 2020-05-10 ENCOUNTER — Emergency Department
Admission: EM | Admit: 2020-05-10 | Discharge: 2020-05-10 | Disposition: A | Discharge status: Home / Self Care | Payer: 59 | Attending: Emergency Medicine | Admitting: Emergency Medicine

## 2020-05-10 DIAGNOSIS — R197 Diarrhea, unspecified: Secondary | ICD-10-CM | POA: Diagnosis not present

## 2020-05-10 DIAGNOSIS — R109 Unspecified abdominal pain: Secondary | ICD-10-CM

## 2020-05-10 DIAGNOSIS — K5900 Constipation, unspecified: Secondary | ICD-10-CM

## 2020-05-10 DIAGNOSIS — R1031 Right lower quadrant pain: Secondary | ICD-10-CM | POA: Diagnosis present

## 2020-05-10 LAB — URINALYSIS, COMPLETE (UACMP) WITH MICROSCOPIC
Bilirubin Urine: NEGATIVE
Glucose, UA: NEGATIVE mg/dL
Hgb urine dipstick: NEGATIVE
Ketones, ur: NEGATIVE mg/dL
Nitrite: NEGATIVE
Protein, ur: NEGATIVE mg/dL
Specific Gravity, Urine: 1.013 (ref 1.005–1.030)
pH: 6 (ref 5.0–8.0)

## 2020-05-10 LAB — COMPREHENSIVE METABOLIC PANEL
ALT: 18 U/L (ref 0–44)
AST: 31 U/L (ref 15–41)
Albumin: 4.1 g/dL (ref 3.5–5.0)
Alkaline Phosphatase: 45 U/L (ref 38–126)
Anion gap: 6 (ref 5–15)
BUN: 8 mg/dL (ref 6–20)
CO2: 28 mmol/L (ref 22–32)
Calcium: 9.1 mg/dL (ref 8.9–10.3)
Chloride: 102 mmol/L (ref 98–111)
Creatinine, Ser: 0.7 mg/dL (ref 0.44–1.00)
GFR calc Af Amer: >60 mL/min (ref >60)
GFR calc non Af Amer: >60 mL/min (ref >60)
Glucose, Bld: 94 mg/dL (ref 70–99)
Potassium: 3.6 mmol/L (ref 3.5–5.1)
Sodium: 136 mmol/L (ref 135–145)
Total Bilirubin: 1 mg/dL (ref 0.3–1.2)
Total Protein: 8.2 g/dL — ABNORMAL HIGH (ref 6.5–8.1)

## 2020-05-10 LAB — CBC
HCT: 42 % (ref 36.0–46.0)
Hemoglobin: 13.7 g/dL (ref 12.0–15.0)
MCH: 28.8 pg (ref 26.0–34.0)
MCHC: 32.6 g/dL (ref 30.0–36.0)
MCV: 88.2 fL (ref 80.0–100.0)
Platelets: 252 10*3/uL (ref 150–400)
RBC: 4.76 MIL/uL (ref 3.87–5.11)
RDW: 13 % (ref 11.5–15.5)
WBC: 6.2 10*3/uL (ref 4.0–10.5)
nRBC: 0 % (ref 0.0–0.2)

## 2020-05-10 LAB — POCT PREGNANCY, URINE: Preg Test, Ur: NEGATIVE

## 2020-05-10 LAB — LIPASE, BLOOD: Lipase: 29 U/L (ref 11–51)

## 2020-05-10 MED ORDER — SODIUM CHLORIDE 0.9% FLUSH: 3.0000 mL | Freq: Once | INTRAVENOUS | Status: DC

## 2020-05-10 MED ORDER — POLYETHYLENE GLYCOL 3350 17 GM/SCOOP PO POWD: 17.0000 g | Freq: Every day | ORAL | 0 refills | Status: DC | PRN

## 2020-05-10 MED ORDER — DOCUSATE SODIUM 100 MG PO CAPS: 100.0000 mg | ORAL_CAPSULE | Freq: Two times a day (BID) | ORAL | 0 refills | Status: DC

## 2020-05-10 NOTE — ED Notes (Signed)
Pt taken to CT at this time.

## 2020-05-10 NOTE — ED Triage Notes (Signed)
Pt comes via EMS with c/o abdominal pain. EMs reports VSS

## 2020-05-10 NOTE — ED Notes (Signed)
Pt. POC was NEGATIVE. 

## 2020-05-10 NOTE — ED Provider Notes (Signed)
[facility] Emergency Department Provider Note  Time seen: 8:41 PM  I have reviewed the triage vital signs and the nursing notes.   HISTORY  Chief Complaint Abdominal Pain   HPI Nancy Brown is a 24 y.o. female with no significant past medical history presents to the emergency department for abdominal pain.  According to the patient  for the past 3 days she has been experiencing loose stool and some lower abdominal pain mostly in the right lower quadrant.  Patient states she is also late on her menstrual cycle and was not sure if she is pregnant.  Patient denies any vomiting.  Denies dysuria or hematuria.  Denies vaginal bleeding or discharge.  Patient does state moderate lower abdominal pain mostly right lower quadrant which has since diminished significantly now currently mild.  Past Medical History:  Diagnosis Date  . Hidradenitis axillaris    surgically removed in 2016.    Patient Active Problem List   Diagnosis Date Noted  . 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 ANA positive 10/21/2015  . Hand discomfort 10/21/2015  . Disruption of wound 12/16/2014    Past Surgical History:  Procedure Laterality Date  . AXILLARY HIDRADENITIS EXCISION Bilateral 12-04-14  . CYST EXCISION Bilateral    side of ears    Prior to Admission medications   Medication Sig Start Date End Date Taking? Authorizing Provider  ibuprofen (ADVIL,MOTRIN) 600 MG tablet Take 1 tablet (600 mg total) by mouth every 6 (six) hours as needed. 09/18/18   Minna Antis, MD  MedroxyPROGESTERone Acetate 150 MG/ML SUSY  01/30/16   [provider]    No Known Allergies  Family History  Problem Relation Age of Onset  . Hypertension Mother   . Asthma Mother   . Thyroid disease Mother   . Hyperlipidemia Mother     Social History Social History   Tobacco Use  . Smoking status: Never Smoker  . Smokeless tobacco: Never Used  Substance Use Topics  . Alcohol use:  No  . Drug use: No    Review of Systems Constitutional: Negative for fever Cardiovascular: Negative for chest pain. Respiratory: Negative for shortness of breath. Gastrointestinal: Lower and right lower quadrant abdominal pain.  Positive for diarrhea. Genitourinary: Negative for urinary compaints Musculoskeletal: Negative for musculoskeletal complaints Neurological: Negative for headache All other ROS negative  ____________________________________________   PHYSICAL EXAM:  VITAL SIGNS: ED Triage Vitals  Enc Vitals Group     BP 05/10/20 1909 128/84     Pulse Rate 05/10/20 1909 82     Resp 05/10/20 1909 17     Temp 05/10/20 1909 99.6 F (37.6 C)     Temp Source 05/10/20 1909 Oral     SpO2 05/10/20 1909 100 %     Weight 05/10/20 1852 140 lb (63.5 kg)     Height 05/10/20 1852 5\' 3"  (1.6 m)     Head Circumference --      Peak Flow --      Pain Score 05/10/20 1852 6     Pain Loc --      Pain Edu? --      Excl. in GC? --     Constitutional: Alert and oriented. Well appearing and in no distress. Eyes: Normal exam ENT      Head: Normocephalic and atraumatic.      Mouth/Throat: Mucous membranes are moist. Cardiovascular: Normal rate, regular rhythm. Respiratory: Normal respiratory effort without tachypnea nor retractions. Breath sounds are clear Gastrointestinal: Soft,  mild diffuse tenderness without focal significant tenderness identified.  No rebound guarding or distention. Musculoskeletal: Nontender with normal range of motion in all extremities.  Neurologic:  Normal speech and language. No gross focal neurologic deficits  Skin:  Skin is warm, dry and intact.  Psychiatric: Mood and affect are normal.   ____________________________________________   RADIOLOGY  CT scan is essentially negative.  ____________________________________________   INITIAL IMPRESSION / ASSESSMENT AND PLAN / ED COURSE  Pertinent labs & imaging results that were available during my care  of the patient were reviewed by me and considered in my medical decision making (see chart for details).   Patient presents emergency department for lower and right lower quadrant abdominal discomfort along with diarrhea for the past 3 days.  Overall the patient appears well, does have mild diffuse abdominal tenderness but no area of significant focal tenderness identified.  Patient's lab work is reassuring including normal white blood cell count, normal LFTs and largely normal urinalysis.  Pregnancy test is negative.  Given the patient's mostly right-sided abdominal pain we will proceed with a CT scan to further evaluate.  Patient agreeable to plan of care.  CT essentially negative there is free fluid in the pelvis however patient once again denies any vaginal discharge or risk for STDs.  I have added on a GC/chlamydia to the patient's urine.  On my review of the CT scan patient appears to have significant amount of constipation which could be contributing to her discomfort.  Reassuringly the rest of her labs are normal.  We will place the patient on MiraLAX and Colace, I discussed very strict return precautions for return of/worsening abdominal pain or development of fever.  Patient agreeable plan of care.   Nancy Brown was evaluated in Emergency Department on 05/10/2020 for the symptoms described in the history of present illness. She was evaluated in the context of the global COVID-19 pandemic, which necessitated consideration that the patient might be at risk for infection with the SARS-CoV-2 virus that causes COVID-19. Institutional protocols and algorithms that pertain to the evaluation of patients at risk for COVID-19 are in a state of rapid change based on information released by regulatory bodies including the CDC and federal and state organizations. These policies and algorithms were followed during the patient's care in the ED.  ____________________________________________   FINAL CLINICAL  IMPRESSION(S) / ED DIAGNOSES  Abdominal pain Constipation   Minna Antis, MD 05/10/20 2112

## 2020-05-10 NOTE — ED Notes (Signed)
Pt back in room at this time.

## 2020-05-10 NOTE — ED Notes (Signed)
Pt states she has burning sensation to lower abd started today. Has had diarrhea x4days, no difficulty urinating.

## 2020-05-10 NOTE — ED Triage Notes (Signed)
Pt comes via [facility] with c/o abdominal pain that started just a few minutes ago. Pt states diarrhea for 3 days. Pt states lower belly pain.  Pt unsure if pregnant. Pt states last period lasted only 4 days and was irregular.

## 2020-09-29 ENCOUNTER — Ambulatory Visit: Payer: 59 | Admitting: Advanced Practice Midwife

## 2020-09-29 ENCOUNTER — Other Ambulatory Visit (HOSPITAL_COMMUNITY)
Admission: RE | Admit: 2020-09-29 | Discharge: 2020-09-29 | Disposition: A | Discharge status: Home / Self Care | Payer: 59 | Source: Ambulatory Visit | Attending: Advanced Practice Midwife | Admitting: Advanced Practice Midwife

## 2020-09-29 ENCOUNTER — Other Ambulatory Visit: Payer: Self-pay

## 2020-09-29 ENCOUNTER — Encounter: Payer: Self-pay | Admitting: Advanced Practice Midwife

## 2020-09-29 VITALS — BP 135/79 | Ht 65.0 in | Wt 145.0 lb

## 2020-09-29 DIAGNOSIS — Z124 Encounter for screening for malignant neoplasm of cervix: Secondary | ICD-10-CM | POA: Insufficient documentation

## 2020-09-29 DIAGNOSIS — Z113 Encounter for screening for infections with a predominantly sexual mode of transmission: Secondary | ICD-10-CM | POA: Diagnosis present

## 2020-09-29 DIAGNOSIS — Z3400 Encounter for supervision of normal first pregnancy, unspecified trimester: Secondary | ICD-10-CM | POA: Diagnosis not present

## 2020-09-29 DIAGNOSIS — Z1379 Encounter for other screening for genetic and chromosomal anomalies: Secondary | ICD-10-CM

## 2020-09-29 DIAGNOSIS — Z3402 Encounter for supervision of normal first pregnancy, second trimester: Secondary | ICD-10-CM | POA: Insufficient documentation

## 2020-09-29 LAB — POCT URINALYSIS DIPSTICK OB
Glucose, UA: NEGATIVE
POC,PROTEIN,UA: NEGATIVE

## 2020-09-29 LAB — POCT URINE PREGNANCY: Preg Test, Ur: POSITIVE — AB

## 2020-09-29 NOTE — Progress Notes (Signed)
New Obstetric Patient H&P    Chief Complaint: "Desires prenatal care"   History of Present Illness: Patient is a 24 y.o. G1P0 Not Hispanic or Latino female, presents with amenorrhea and positive home pregnancy test. Patient's last menstrual period was 06/13/2020. and based on her  LMP, her EDD is Estimated Date of Delivery: 03/20/21 and her EGA is [id number]. Cycles are 7 days, regular, and occur approximately every : 28 days. Her last pap smear was 3 or 4 years ago and was no abnormalities.    She had a urine pregnancy test which was positive 2 or 3 month(s)  ago. Her last menstrual period was shorter and lasted for  4 or 5 day(s). Since her LMP she claims she has experienced breast tenderness, fatigue, nausea. She had several days of spotting in September and October. Her past medical history is noncontributory. This is her first pregnancy.  Since her LMP, she admits to the use of tobacco products  no She claims she has gained  a few pounds since the start of her pregnancy.  There are cats in the home in the home  no  She admits close contact with children on a regular basis  no  She has had chicken pox in the past no She has had Tuberculosis exposures, symptoms, or previously tested positive for TB   no Current or past history of domestic violence. no  Genetic Screening/Teratology Counseling: (Includes patient, baby's father, or anyone in either family with:)   1. [facility]  no 2. Thalassemia ([place], [place], Mediterranean, or Asian background): MCV<80  no 3. Neural tube defect (meningomyelocele, spina bifida, anencephaly)  no 4. Congenital heart defect  no  5. Down syndrome  no 6. Tay-Sachs (Jewish, [place])  no 7. Canavan's Disease  no 8. Sickle cell disease or trait (African)  no  9. Hemophilia or other blood disorders  no  10. Muscular dystrophy  no  11. Cystic fibrosis  no  12. Huntington's Chorea  no  13. Mental retardation/autism  no 14. Other  inherited genetic or chromosomal disorder  no 15. Maternal metabolic disorder (DM, PKU, etc)  no 16. Patient or FOB with a child with a birth defect not listed above no  16a. Patient or FOB with a birth defect themselves no 17. Recurrent pregnancy loss, or stillbirth  no  18. Any medications since LMP other than prenatal vitamins (include vitamins, supplements, OTC meds, drugs, alcohol)  no 19. Any other genetic/environmental exposure to discuss  no  Infection History:   1. Lives with someone with TB or TB exposed  no  2. Patient or partner has history of genital herpes  no 3. Rash or viral illness since LMP  no 4. History of STI (GC, CT, HPV, syphilis, HIV)  no 5. 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    Review of Systems:10 point review of systems negative unless otherwise noted in HPI  Past Medical History:  Patient Active Problem List   Diagnosis Date Noted  . Supervision of normal first pregnancy 09/29/2020    Clinic [facility] Prenatal Labs  Dating  Blood type:     Genetic Screen 1 Screen:    AFP:     Quad:     NIPS: Antibody:   Anatomic US  Rubella:    Varicella: @VZVIGG @  GTT Early: NA  Third trimester:  RPR:     Rhogam  HBsAg:     Vaccines TDAP:                       Flu Shot: Covid: HIV:     Baby Food                                GBS:   GC/CT:  Contraception  Pap: 09/29/20  CBB     CS/VBAC NA   Support Person Adam       . 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 ANA positive 10/21/2015  . Hand discomfort 10/21/2015  . Disruption of wound 12/16/2014    Past Surgical History:  Past Surgical History:  Procedure Laterality Date  . AXILLARY HIDRADENITIS EXCISION Bilateral 12-04-14  . CYST EXCISION Bilateral    side of ears    Gynecologic History: Patient's last menstrual period was 06/13/2020.  Obstetric History: G1P0  Family History:  Family History   Problem Relation Age of Onset  . Hypertension Mother   . Asthma Mother   . Thyroid disease Mother   . Hyperlipidemia Mother     Social History:  Social History   Socioeconomic History  . Marital status: Single    Spouse name: Not on file  . Number of children: Not on file  . Years of education: Not on file  . Highest education level: Not on file  Occupational History  . Not on file  Tobacco Use  . Smoking status: Never Smoker  . Smokeless tobacco: Never Used  Substance and Sexual Activity  . Alcohol use: No  . Drug use: No  . Sexual activity: Not on file  Other Topics Concern  . Not on file  Social History Narrative  . Not on file   Social Determinants of Health   Financial Resource Strain:   . Difficulty of Paying Living Expenses: Not on file  Food Insecurity:   . Worried About Programme researcher, broadcasting/film/video in the Last Year: Not on file  . Ran Out of Food in the Last Year: Not on file  Transportation Needs:   . Lack of Transportation (Medical): Not on file  . Lack of Transportation (Non-Medical): Not on file  Physical Activity:   . Days of Exercise per Week: Not on file  . Minutes of Exercise per Session: Not on file  Stress:   . Feeling of Stress : Not on file  Social Connections:   . Frequency of Communication with Friends and Family: Not on file  . Frequency of Social Gatherings with Friends and Family: Not on file  . Attends Religious Services: Not on file  . Active Member of Clubs or Organizations: Not on file  . Attends Banker Meetings: Not on file  . Marital Status: Not on file  Intimate Partner Violence:   . Fear of Current or Ex-Partner: Not on file  . Emotionally Abused: Not on file  . Physically Abused: Not on file  . Sexually Abused: Not on file    Allergies:  No Known Allergies  Medications: Prior to Admission medications   Medication Sig Start Date End Date Taking? Authorizing Provider  ibuprofen (ADVIL,MOTRIN) 600 MG tablet Take 1  tablet (600 mg total) by mouth every 6 (six) hours as needed. 09/18/18  Yes Minna Antis, MD    Physical Exam Vitals: Blood pressure 135/79, height 5\' 5"  (  1.651 m), weight 145 lb (65.8 kg), last menstrual period 06/13/2020.  General: NAD HEENT: normocephalic, anicteric Thyroid: no enlargement, no palpable nodules Pulmonary: No increased work of breathing, CTAB Cardiovascular: RRR, distal pulses 2+ Abdomen: NABS, soft, non-tender, non-distended.  Umbilicus without lesions.  No hepatomegaly, splenomegaly or masses palpable. No evidence of hernia  Genitourinary:  External: Normal external female genitalia.  Normal urethral meatus, normal Bartholin's and Skene's glands.    Vagina: Normal vaginal mucosa, no evidence of prolapse.    Cervix: Grossly normal in appearance, no bleeding, no CMT  Uterus: Non-enlarged, mobile, normal contour.    Adnexa: ovaries non-enlarged, no adnexal masses  Rectal: deferred Extremities: no edema, erythema, or tenderness Neurologic: Grossly intact Psychiatric: mood appropriate, affect full  Unable to hear fetal heart tones by doppler or visualize fetus with BS u/s.  The following were addressed during this visit:  Breastfeeding Education - Early initiation of breastfeeding    Comments: Keeps milk supply adequate, helps contract uterus and slow bleeding, and early milk is the perfect first food and is easy to digest.   - The importance of exclusive breastfeeding    Comments: Provides antibodies, Lower risk of breast and ovarian cancers, and type-2 diabetes,Helps your body recover, Reduced chance of SIDS.   - Risks of giving your baby anything other than breast milk if you are breastfeeding    Comments: Make the baby less content with breastfeeds, may make my baby more susceptible to illness, and may reduce my milk supply.   - The importance of early skin-to-skin contact    Comments: Keeps baby warm and secure, helps keep baby's blood sugar up and  breathing steady, easier to bond and breastfeed, and helps calm baby.  - Rooming-in on a 24-hour basis    Comments: Easier to learn baby's feeding cues, easier to bond and get to know each other, and encourages milk production.   - Feeding on demand or baby-led feeding    Comments: Helps prevent breastfeeding complications, helps bring in good milk supply, prevents under or overfeeding, and helps baby feel content and satisfied   - Frequent feeding to help assure optimal milk production    Comments: Making a full supply of milk requires frequent removal of milk from breasts, infant will eat 8-12 times in 24 hours, if separated from infant use breast massage, hand expression and/ or pumping to remove milk from breasts.   - Effective positioning and attachment    Comments: Helps my baby to get enough breast milk, helps to produce an adequate milk supply, and helps prevent nipple pain and damage   - Exclusive breastfeeding for the first 6 months    Comments: Builds a healthy milk supply and keeps it up, protects baby from sickness and disease, and breastmilk has everything your baby needs for the first 6 months.    Assessment: 24 y.o. G1P0 at [redacted]w[redacted]d presenting to initiate prenatal care  Plan: 1) Avoid alcoholic beverages. 2) Patient encouraged not to smoke.  3) Discontinue the use of all non-medicinal drugs and chemicals.  4) Take prenatal vitamins daily.  5) Nutrition, food safety (fish, cheese advisories, and high nitrite foods) and exercise discussed. 6) Hospital and practice style discussed with cross coverage system.  7) Genetic Screening, such as with 1st Trimester Screening, cell free fetal DNA, AFP testing, and Ultrasound, as well as with amniocentesis and CVS as appropriate, is discussed with patient. At the conclusion of today's visit patient requested genetic testing 8) Patient is asked about  travel to areas at risk for the Zika virus, and counseled to avoid travel and  exposure to mosquitoes or sexual partners who may have themselves been exposed to the virus. Testing is discussed, and will be ordered as appropriate.  9) PAPtima, urine culture, Beta Hcg today 10) Beta Hcg in 2 days 11) Return to clinic in 1 week for dating scan and labs and rob MaterniT 21 at 10+ weeks   Tresea Mall, CNM [facility] 09/29/2020, 4:00 PM

## 2020-09-29 NOTE — Progress Notes (Signed)
NOB - Need something for nausea. RM 3

## 2020-09-29 NOTE — Progress Notes (Signed)
NOB - Need something for nausea. RM 3 

## 2020-09-29 NOTE — Patient Instructions (Signed)
Exercise During Pregnancy Exercise is an important part of being healthy for people of all ages. Exercise improves the function of your heart and lungs and helps you maintain strength, flexibility, and a healthy body weight. Exercise also boosts energy levels and elevates mood. Most women should exercise regularly during pregnancy. In rare cases, women with certain medical conditions or complications may be asked to limit or avoid exercise during pregnancy. How does this affect me? Along with maintaining general strength and flexibility, exercising during pregnancy can help:  Keep strength in muscles that are used during labor and childbirth.  Decrease low back pain.  Reduce symptoms of depression.  Control weight gain during pregnancy.  Reduce the risk of needing insulin if you develop diabetes during pregnancy.  Decrease the risk of cesarean delivery.  Speed up your recovery after giving birth. How does this affect my baby? Exercise can help you have a healthy pregnancy. Exercise does not cause premature birth. It will not cause your baby to weigh less at birth. What exercises can I do? Many exercises are safe for you to do during pregnancy. Do a variety of exercises that safely increase your heart and breathing rates and help you build and maintain muscle strength. Do exercises exactly as told by your health care provider. You may do these exercises:  Walking or hiking.  Swimming.  Water aerobics.  Riding a stationary bike.  Strength training.  Modified yoga or Pilates. Tell your instructor that you are pregnant. Avoid overstretching, and avoid lying on your back for long periods of time.  Running or jogging. Only choose this type of exercise if you: ? Ran or jogged regularly before your pregnancy. ? Can run or jog and still talk in complete sentences. What exercises should I avoid? Depending on your level of fitness and whether you exercised regularly before your  pregnancy, you may be told to limit high-intensity exercise. You can tell that you are exercising at a high intensity if you are breathing much harder and faster and cannot hold a conversation while exercising. You must avoid:  Contact sports.  Activities that put you at risk for falling on or being hit in the belly, such as downhill skiing, water skiing, surfing, rock climbing, cycling, gymnastics, and horseback riding.  Scuba diving.  Skydiving.  Yoga or Pilates in a room that is heated to high temperatures.  Jogging or running, unless you ran or jogged regularly before your pregnancy. While jogging or running, you should always be able to talk in full sentences. Do not run or jog so fast that you are unable to have a conversation.  Do not exercise at more than 6,000 feet above sea level (high elevation) if you are not used to exercising at high elevation. How do I exercise in a safe way?   Avoid overheating. Do not exercise in very high temperatures.  Wear loose-fitting, breathable clothes.  Avoid dehydration. Drink enough water before, during, and after exercise to keep your urine pale yellow.  Avoid overstretching. Because of hormone changes during pregnancy, it is easy to overstretch muscles, tendons, and ligaments during pregnancy.  Start slowly and ask your health care provider to recommend the types of exercise that are safe for you.  Do not exercise to lose weight. Follow these instructions at home:  Exercise on most days or all days of the week. Try to exercise for 30 minutes a day, 5 days a week, unless your health care provider tells you not to.  If   you actively exercised before your pregnancy and you are healthy, your health care provider may tell you to continue to do moderate to high-intensity exercise.  If you are just starting to exercise or did not exercise much before your pregnancy, your health care provider may tell you to do low to moderate-intensity  exercise. Questions to ask your health care provider  Is exercise safe for me?  What are signs that I should stop exercising?  Does my health condition mean that I should not exercise during pregnancy?  When should I avoid exercising during pregnancy? Stop exercising and contact a health care provider if: You have any unusual symptoms, such as:  Mild contractions of the uterus or cramps in the abdomen.  Dizziness that does not go away when you rest. Stop exercising and get help right away if: You have any unusual symptoms, such as:  Sudden, severe pain in your low back or your belly.  Mild contractions of the uterus or cramps in the abdomen that do not improve with rest and drinking fluids.  Chest pain.  Bleeding or fluid leaking from your vagina.  Shortness of breath. These symptoms may represent a serious problem that is an emergency. Do not wait to see if the symptoms will go away. Get medical help right away. Call your local emergency services (911 in the [place] Do not drive yourself to the hospital. Summary  Most women should exercise regularly throughout pregnancy. In rare cases, women with certain medical conditions or complications may be asked to limit or avoid exercise during pregnancy.  Do not exercise to lose weight during pregnancy.  Your health care provider will tell you what level of physical activity is right for you.  Stop exercising and contact a health care provider if you have mild contractions of the uterus or cramps in the abdomen. Get help right away if these contractions or cramps do not improve with rest and drinking fluids.  Stop exercising and get help right away if you have sudden, severe pain in your low back or belly, chest pain, shortness of breath, or bleeding or leaking of fluid from your vagina. This information is not intended to replace advice given to you by your health care provider. Make sure you discuss any questions you have with your  health care provider. Document Revised: 01/30/2019 Document Reviewed: 11/13/2018 Elsevier Patient Education  2020 [employer] Eating Plan for Pregnant Women While you are pregnant, your body requires additional nutrition to help support your growing baby. You also have a higher need for some vitamins and minerals, such as folic acid, calcium, iron, and vitamin D. Eating a healthy, well-balanced diet is very important for your health and your baby's health. Your need for extra calories varies for the three 3-month segments of your pregnancy (trimesters). For most women, it is recommended to consume:  150 extra calories a day during the first trimester.  300 extra calories a day during the second trimester.  300 extra calories a day during the third trimester. What are tips for following this plan?   Do not try to lose weight or go on a diet during pregnancy.  Limit your overall intake of foods that have "empty calories." These are foods that have little nutritional value, such as sweets, desserts, candies, and sugar-sweetened beverages.  Eat a variety of foods (especially fruits and vegetables) to get a full range of vitamins and minerals.  Take a prenatal vitamin to help meet your additional vitamin and mineral needs   during pregnancy, specifically for folic acid, iron, calcium, and vitamin D.  Remember to stay active. Ask your health care provider what types of exercise and activities are safe for you.  Practice good food safety and cleanliness. Wash your hands before you eat and after you prepare raw meat. Wash all fruits and vegetables well before peeling or eating. Taking these actions can help to prevent food-borne illnesses that can be very dangerous to your baby, such as listeriosis. Ask your health care provider for more information about listeriosis. What does 150 extra calories look like? Healthy options that provide 150 extra calories each day could be any of the  following:  6-8 oz (170-230 g) of plain low-fat yogurt with  cup of berries.  1 apple with 2 teaspoons (11 g) of peanut butter.  Cut-up vegetables with  cup (60 g) of hummus.  8 oz (230 mL) or 1 cup of low-fat chocolate milk.  1 stick of string cheese with 1 medium orange.  1 peanut butter and jelly sandwich that is made with one slice of whole-wheat bread and 1 tsp (5 g) of peanut butter. For 300 extra calories, you could eat two of those healthy options each day. What is a healthy amount of weight to gain? The right amount of weight gain for you is based on your BMI before you became pregnant. If your BMI:  Was less than 18 (underweight), you should gain 28-40 lb (13-18 kg).  Was 18-24.9 (normal), you should gain 25-35 lb (11-16 kg).  Was 25-29.9 (overweight), you should gain 15-25 lb (7-11 kg).  Was 30 or greater (obese), you should gain 11-20 lb (5-9 kg). What if I am having twins or multiples? Generally, if you are carrying twins or multiples:  You may need to eat 300-600 extra calories a day.  The recommended range for total weight gain is 25-54 lb (11-25 kg), depending on your BMI before pregnancy.  Talk with your health care provider to find out about nutritional needs, weight gain, and exercise that is right for you. What foods can I eat?  Fruits All fruits. Eat a variety of colors and types of fruit. Remember to wash your fruits well before peeling or eating. Vegetables All vegetables. Eat a variety of colors and types of vegetables. Remember to wash your vegetables well before peeling or eating. Grains All grains. Choose whole grains, such as whole-wheat bread, oatmeal, or brown rice. Meats and other protein foods Lean meats, including chicken, turkey, fish, and lean cuts of beef, veal, or pork. If you eat fish or seafood, choose options that are higher in omega-3 fatty acids and lower in mercury, such as salmon, herring, mussels, trout, sardines, pollock,  shrimp, crab, and lobster. Tofu. Tempeh. Beans. Eggs. Peanut butter and other nut butters. Make sure that all meats, poultry, and eggs are cooked to food-safe temperatures or "well-done." Two or more servings of fish are recommended each week in order to get the most benefits from omega-3 fatty acids that are found in seafood. Choose fish that are lower in mercury. You can find more information online:  www.fda.gov Dairy Pasteurized milk and milk alternatives (such as almond milk). Pasteurized yogurt and pasteurized cheese. Cottage cheese. Sour cream. Beverages Water. Juices that contain 100% fruit juice or vegetable juice. Caffeine-free teas and decaffeinated coffee. Drinks that contain caffeine are okay to drink, but it is better to avoid caffeine. Keep your total caffeine intake to less than 200 mg each day (which is 12 oz   or 355 mL of coffee, tea, or soda) or the limit as told by your health care provider. Fats and oils Fats and oils are okay to include in moderation. Sweets and desserts Sweets and desserts are okay to include in moderation. Seasoning and other foods All pasteurized condiments. The items listed above may not be a complete list of foods and beverages you can eat. Contact a dietitian for more information. What foods are not recommended? Fruits Unpasteurized fruit juices. Vegetables Raw (unpasteurized) vegetable juices. Meats and other protein foods Lunch meats, bologna, hot dogs, or other deli meats. (If you must eat those meats, reheat them until they are steaming hot.) Refrigerated pat, meat spreads from a meat counter, smoked seafood that is found in the refrigerated section of a store. Raw or undercooked meats, poultry, and eggs. Raw fish, such as sushi or sashimi. Fish that have high mercury content, such as tilefish, shark, swordfish, and king mackerel. To learn more about mercury in fish, talk with your health care provider or look for online resources, such  as:  www.fda.gov Dairy Raw (unpasteurized) milk and any foods that have raw milk in them. Soft cheeses, such as feta, queso blanco, queso fresco, Brie, Camembert cheeses, blue-veined cheeses, and Panela cheese (unless it is made with pasteurized milk, which must be stated on the label). Beverages Alcohol. Sugar-sweetened beverages, such as sodas, teas, or energy drinks. Seasoning and other foods Homemade fermented foods and drinks, such as pickles, sauerkraut, or kombucha drinks. (Store-bought pasteurized versions of these are okay.) Salads that are made in a store or deli, such as ham salad, chicken salad, egg salad, tuna salad, and seafood salad. The items listed above may not be a complete list of foods and beverages you should avoid. Contact a dietitian for more information. Where to find more information To calculate the number of calories you need based on your height, weight, and activity level, you can use an online calculator such as:  www.choosemyplate.gov/MyPlatePlan To calculate how much weight you should gain during pregnancy, you can use an online pregnancy weight gain calculator such as:  www.choosemyplate.gov/pregnancy-weight-gain-calculator Summary  While you are pregnant, your body requires additional nutrition to help support your growing baby.  Eat a variety of foods, especially fruits and vegetables to get a full range of vitamins and minerals.  Practice good food safety and cleanliness. Wash your hands before you eat and after you prepare raw meat. Wash all fruits and vegetables well before peeling or eating. Taking these actions can help to prevent food-borne illnesses, such as listeriosis, that can be very dangerous to your baby.  Do not eat raw meat or fish. Do not eat fish that have high mercury content, such as tilefish, shark, swordfish, and king mackerel. Do not eat unpasteurized (raw) dairy.  Take a prenatal vitamin to help meet your additional vitamin and  mineral needs during pregnancy, specifically for folic acid, iron, calcium, and vitamin D. This information is not intended to replace advice given to you by your health care provider. Make sure you discuss any questions you have with your health care provider. Document Revised: 02/27/2019 Document Reviewed: 07/06/2017 Elsevier Patient Education  2020 Elsevier Inc. Prenatal Care Prenatal care is health care during pregnancy. It helps you and your unborn baby (fetus) stay as healthy as possible. Prenatal care may be provided by a midwife, a family practice health care provider, or a childbirth and pregnancy specialist (obstetrician). How does this affect me? During pregnancy, you will be closely monitored   for any new conditions that might develop. To lower your risk of pregnancy complications, you and your health care provider will talk about any underlying conditions you have. How does this affect my baby? Early and consistent prenatal care increases the chance that your baby will be healthy during pregnancy. Prenatal care lowers the risk that your baby will be:  Born early (prematurely).  Smaller than expected at birth (small for gestational age). What can I expect at the first prenatal care visit? Your first prenatal care visit will likely be the longest. You should schedule your first prenatal care visit as soon as you know that you are pregnant. Your first visit is a good time to talk about any questions or concerns you have about pregnancy. At your visit, you and your health care provider will talk about:  Your medical history, including: ? Any past pregnancies. ? Your family's medical history. ? The baby's father's medical history. ? Any long-term (chronic) health conditions you have and how you manage them. ? Any surgeries or procedures you have had. ? Any current over-the-counter or prescription medicines, herbs, or supplements you are taking.  Other factors that could pose a risk  to your baby, including:  Your home setting and your stress levels, including: ? Exposure to abuse or violence. ? Household financial strain. ? Mental health conditions you have.  Your daily health habits, including diet and exercise. Your health care provider will also:  Measure your weight, height, and blood pressure.  Do a physical exam, including a pelvic and breast exam.  Perform blood tests and urine tests to check for: ? Urinary tract infection. ? Sexually transmitted infections (STIs). ? Low iron levels in your blood (anemia). ? Blood type and certain proteins on red blood cells (Rh antibodies). ? Infections and immunity to viruses, such as hepatitis B and rubella. ? HIV (human immunodeficiency virus).  Do an ultrasound to confirm your baby's growth and development and to help predict your estimated due date (EDD). This ultrasound is done with a probe that is inserted into the vagina (transvaginal ultrasound).  Discuss your options for genetic screening.  Give you information about how to keep yourself and your baby healthy, including: ? Nutrition and taking vitamins. ? Physical activity. ? How to manage pregnancy symptoms such as nausea and vomiting (morning sickness). ? Infections and substances that may be harmful to your baby and how to avoid them. ? Food safety. ? Dental care. ? Working. ? Travel. ? Warning signs to watch for and when to call your health care provider. How often will I have prenatal care visits? After your first prenatal care visit, you will have regular visits throughout your pregnancy. The visit schedule is often as follows:  Up to week 28 of pregnancy: once every 4 weeks.  28-36 weeks: once every 2 weeks.  After 36 weeks: every week until delivery. Some women may have visits more or less often depending on any underlying health conditions and the health of the baby. Keep all follow-up and prenatal care visits as told by your health care  provider. This is important. What happens during routine prenatal care visits? Your health care provider will:  Measure your weight and blood pressure.  Check for fetal heart sounds.  Measure the height of your uterus in your abdomen (fundal height). This may be measured starting around week 20 of pregnancy.  Check the position of your baby inside your uterus.  Ask questions about your diet, sleeping patterns, and   whether you can feel the baby move.  Review warning signs to watch for and signs of labor.  Ask about any pregnancy symptoms you are having and how you are dealing with them. Symptoms may include: ? Headaches. ? Nausea and vomiting. ? Vaginal discharge. ? Swelling. ? Fatigue. ? Constipation. ? Any discomfort, including back or pelvic pain. Make a list of questions to ask your health care provider at your routine visits. What tests might I have during prenatal care visits? You may have blood, urine, and imaging tests throughout your pregnancy, such as:  Urine tests to check for glucose, protein, or signs of infection.  Glucose tests to check for a form of diabetes that can develop during pregnancy (gestational diabetes mellitus). This is usually done around week 24 of pregnancy.  An ultrasound to check your baby's growth and development and to check for birth defects. This is usually done around week 20 of pregnancy.  A test to check for group B strep (GBS) infection. This is usually done around week 36 of pregnancy.  Genetic testing. This may include blood or imaging tests, such as an ultrasound. Some genetic tests are done during the first trimester and some are done during the second trimester. What else can I expect during prenatal care visits? Your health care provider may recommend getting certain vaccines during pregnancy. These may include:  A yearly flu shot (annual influenza vaccine). This is especially important if you will be pregnant during flu  season.  Tdap (tetanus, diphtheria, pertussis) vaccine. Getting this vaccine during pregnancy can protect your baby from whooping cough (pertussis) after birth. This vaccine may be recommended between weeks 27 and 36 of pregnancy. Later in your pregnancy, your health care provider may give you information about:  Childbirth and breastfeeding classes.  Choosing a health care provider for your baby.  Umbilical cord banking.  Breastfeeding.  Birth control after your baby is born.  The hospital labor and delivery unit and how to tour it.  Registering at the hospital before you go into labor. Where to find more information  Office on Women's Health: womenshealth.gov  American Pregnancy Association: americanpregnancy.org  March of Dimes: marchofdimes.org Summary  Prenatal care helps you and your baby stay as healthy as possible during pregnancy.  Your first prenatal care visit will most likely be the longest.  You will have visits and tests throughout your pregnancy to monitor your health and your baby's health.  Bring a list of questions to your visits to ask your health care provider.  Make sure to keep all follow-up and prenatal care visits with your health care provider. This information is not intended to replace advice given to you by your health care provider. Make sure you discuss any questions you have with your health care provider. Document Revised: 01/29/2019 Document Reviewed: 10/08/2017 Elsevier Patient Education  2020 Elsevier Inc.  

## 2020-09-30 LAB — BETA HCG QUANT (REF LAB): hCG Quant: 72119 m[IU]/mL

## 2020-10-01 ENCOUNTER — Other Ambulatory Visit: Payer: Self-pay | Admitting: Advanced Practice Midwife

## 2020-10-01 DIAGNOSIS — A599 Trichomoniasis, unspecified: Secondary | ICD-10-CM

## 2020-10-01 LAB — CYTOLOGY - PAP
Chlamydia: NEGATIVE
Comment: NEGATIVE
Comment: NEGATIVE
Comment: NORMAL
Diagnosis: NEGATIVE
Neisseria Gonorrhea: NEGATIVE
Trichomonas: POSITIVE — AB

## 2020-10-01 MED ORDER — METRONIDAZOLE 500 MG PO TABS: 2000.0000 mg | ORAL_TABLET | Freq: Once | ORAL | 0 refills | Status: AC

## 2020-10-01 NOTE — Progress Notes (Signed)
Rx metronidazole 2g sent to patient's pharmacy to treat trichomonal infection.

## 2020-10-05 ENCOUNTER — Telehealth: Payer: Self-pay

## 2020-10-05 NOTE — Telephone Encounter (Signed)
Patient states she received +Covid results yesterday. Inquiring what next steps she should take. She reports she is hydrated. TM#[phone]

## 2020-10-05 NOTE — Telephone Encounter (Signed)
Spoke w/patient. Advised her apt tomorrow will need to be telephone or virtual. She is ok with either. She is having chills, body aches, headache, congestion, denies fever at this time. Advised can take OTC meds on Safe meds in pregnancy list given to her at her [facility] apt. She has this on hand. Will send to provider for review/recommendations for infusion therapy.

## 2020-10-06 NOTE — Telephone Encounter (Signed)
I spoke with patient and she is agreeable to the infusion. I sent the request and wondering about it because an auto reply said the associated NP wasn't available until 10/18/20. That's a long time from now. Are you able to look into that?

## 2020-10-07 ENCOUNTER — Other Ambulatory Visit: Payer: Self-pay | Admitting: Family Medicine

## 2020-10-07 ENCOUNTER — Ambulatory Visit (INDEPENDENT_AMBULATORY_CARE_PROVIDER_SITE_OTHER): Payer: 59 | Admitting: Obstetrics and Gynecology

## 2020-10-07 ENCOUNTER — Ambulatory Visit: Payer: 59

## 2020-10-07 ENCOUNTER — Encounter: Payer: Self-pay | Admitting: Obstetrics and Gynecology

## 2020-10-07 ENCOUNTER — Other Ambulatory Visit: Payer: Self-pay

## 2020-10-07 DIAGNOSIS — U071 COVID-19: Secondary | ICD-10-CM

## 2020-10-07 DIAGNOSIS — O99112 Other diseases of the blood and blood-forming organs and certain disorders involving the immune mechanism complicating pregnancy, second trimester: Secondary | ICD-10-CM | POA: Diagnosis not present

## 2020-10-07 DIAGNOSIS — O98519 Other viral diseases complicating pregnancy, unspecified trimester: Secondary | ICD-10-CM | POA: Insufficient documentation

## 2020-10-07 DIAGNOSIS — Z3402 Encounter for supervision of normal first pregnancy, second trimester: Secondary | ICD-10-CM

## 2020-10-07 DIAGNOSIS — Z3A16 16 weeks gestation of pregnancy: Secondary | ICD-10-CM

## 2020-10-07 DIAGNOSIS — R760 Raised antibody titer: Secondary | ICD-10-CM | POA: Diagnosis not present

## 2020-10-07 DIAGNOSIS — O98512 Other viral diseases complicating pregnancy, second trimester: Secondary | ICD-10-CM | POA: Diagnosis not present

## 2020-10-07 DIAGNOSIS — A084 Viral intestinal infection, unspecified: Secondary | ICD-10-CM

## 2020-10-07 DIAGNOSIS — Z3400 Encounter for supervision of normal first pregnancy, unspecified trimester: Secondary | ICD-10-CM

## 2020-10-07 NOTE — Telephone Encounter (Signed)
Per Tresea Mall, CNM, she received a response from the Infusion team. They will contact patient for scheduling.

## 2020-10-07 NOTE — Telephone Encounter (Signed)
Requested medication (s) are due for refill today:   No.  Rx is from 3 yrs ago  Requested medication (s) are on the active medication list:   No.  Discontinued 03/08/2018  Future visit scheduled:   No last seen 2 yrs ago   Last ordered: 11/27/2016 #20, 0 refills  Non delegated refill.      Requested Prescriptions  Pending Prescriptions Disp Refills   ondansetron (ZOFRAN-ODT) 4 MG disintegrating tablet [Pharmacy Med Name: Ondansetron 4 MG Oral Tablet Disintegrating] 20 tablet 0    Sig: DISSOLVE ONE TABLET IN MOUTH EVERY 8 HOURS AS NEEDED FOR NAUSEA OR VOMITING      Not Delegated - Gastroenterology: Antiemetics Failed - 10/07/2020 11:13 AM      Failed - This refill cannot be delegated      Failed - Valid encounter within last 6 months    Recent Outpatient Visits           2 years ago Acute nonintractable headache, unspecified headache type   [facility] Smitty Cords, DO   3 years ago Viral gastroenteritis   [facility] [place], Netta Neat, DO   4 years ago Lateral knee pain, left   [facility] Laroy Apple, Laurel Dimmer, NP   5 years ago Swelling of joint of hand, right   [facility] Laroy Apple, Laurel Dimmer, NP

## 2020-10-07 NOTE — Progress Notes (Signed)
Routine Prenatal Care Visit- Virtual Visit  Subjective   Virtual Visit via Telephone Note  I connected with Nancy Brown on 10/07/20 at 11:30 AM EST by telephone and verified that I am speaking with the correct person using two identifiers.   I discussed the limitations, risks, security and privacy concerns of performing an evaluation and management service by telephone and the availability of in person appointments. I also discussed with the patient that there may be a patient responsible charge related to this service. The patient expressed understanding and agreed to proceed.  The patient was at home I spoke with the patient from my  office The names of people involved in this encounter were: Jule Economy and Thomasene Mohair, MD.   Nancy Brown is a 24 y.o. G1P0 at [redacted]w[redacted]d being seen today for ongoing prenatal care.  She is currently monitored for the following issues for this low-risk pregnancy and has Disruption of wound; ANA positive; False-positive serological test result; Hand discomfort; Supervision of normal first pregnancy; and COVID-19 affecting pregnancy, antepartum on their problem list.  ----------------------------------------------------------------------------------- Patient reports symptoms below. She had a positive COVID19 test on 10/04/2020.  She has congestion, HA, body chills, back aching, loss of taste, sore throat. Denies trouble breathing. Denies fevers.    She never received covid19 vaccination.  Phone call today, was DX with COVID on Monday. No vb. No lof.   . Vag. Bleeding: None.  Movement: Present. Denies leaking of fluid.  ----------------------------------------------------------------------------------- The following portions of the patient's history were reviewed and updated as appropriate: allergies, current medications, past family history, past medical history, past social history, past surgical history and problem list. Problem list updated.  Objective   Last menstrual period 06/13/2020. Pregravid weight Pregravid weight not on file Total Weight Gain Not found. Urinalysis:      Fetal Status:     Movement: Present     Physical Exam could not be performed. Because of the COVID-19 outbreak this visit was performed over the phone and not in person.   Assessment   24 y.o. G1P0 at [redacted]w[redacted]d by  03/20/2021, by Last Menstrual Period presenting for routine prenatal visit  Plan   pregnancy 1 Problems (from 09/29/20 to present)    Problem Noted Resolved   Supervision of normal first pregnancy 09/29/2020 by Tresea Mall, CNM No   Overview Signed 09/29/2020  3:57 PM by Tresea Mall, CNM    Clinic [facility] Prenatal Labs  Dating  Blood type:     Genetic Screen 1 Screen:    AFP:     Quad:     NIPS: Antibody:   Anatomic [place]  Rubella:    Varicella: @VZVIGG @  GTT Early: NA               Third trimester:  RPR:     Rhogam  HBsAg:     Vaccines TDAP:                       Flu Shot: Covid: HIV:     Baby Food                                GBS:   GC/CT:  Contraception  Pap: 09/29/20  CBB     CS/VBAC NA   Support Person Adam              Gestational age appropriate obstetric precautions including but not  limited to vaginal bleeding, contractions, leaking of fluid and fetal movement were reviewed in detail with the patient.     Follow Up Instructions: - discussed COVID19 MAb infusion. She is interested. Basically discussed risks and benefits. Will contact infusion team to consent her and arrange.    I discussed the assessment and treatment plan with the patient. The patient was provided an opportunity to ask questions and all were answered. The patient agreed with the plan and demonstrated an understanding of the instructions.   The patient was advised to call back or seek an in-person evaluation if the symptoms worsen or if the condition fails to improve as anticipated.  I provided 12 minutes of non-face-to-face time during this  encounter.  Return in about 2 weeks (around 10/21/2020) for dating ultrasound, labs, routine prenatal visit.  Thomasene Mohair, MD  [facility], [facility] 10/07/2020 11:02 AM

## 2020-10-08 ENCOUNTER — Telehealth: Payer: Self-pay | Admitting: Physician Assistant

## 2020-10-08 NOTE — Telephone Encounter (Signed)
Called to discuss with patient about Covid symptoms and the use of sotrovimab, bamlanivimab/etesevimab or casirivimab/imdevimab, a monoclonal antibody infusion for those with mild to moderate Covid symptoms and at a high risk of hospitalization.  Pt is qualified for this infusion at the [facility] center due to; Specific high risk criteria : Pregnancy   Message left to call back our hotline [phone]. My chart message sent if active on Mychart.   Cline Crock PA-C

## 2020-10-09 ENCOUNTER — Telehealth: Payer: Self-pay | Admitting: Physician Assistant

## 2020-10-09 NOTE — Telephone Encounter (Signed)
Called to Discuss with patient about Covid symptoms and the use of the monoclonal antibody infusion for those with mild to moderate Covid symptoms and at a high risk of hospitalization.     Pt appears to qualify for this infusion due to co-morbid conditions and/or a member of an at-risk group in accordance with the FDA Emergency Use Authorization.    Unable to reach pt - left another VM.

## 2020-10-20 ENCOUNTER — Other Ambulatory Visit: Payer: Self-pay | Admitting: Advanced Practice Midwife

## 2020-10-20 ENCOUNTER — Ambulatory Visit (INDEPENDENT_AMBULATORY_CARE_PROVIDER_SITE_OTHER): Payer: 59 | Admitting: Obstetrics and Gynecology

## 2020-10-20 ENCOUNTER — Ambulatory Visit (INDEPENDENT_AMBULATORY_CARE_PROVIDER_SITE_OTHER): Payer: 59

## 2020-10-20 ENCOUNTER — Other Ambulatory Visit: Payer: Self-pay

## 2020-10-20 VITALS — BP 128/68 | Wt 148.0 lb

## 2020-10-20 DIAGNOSIS — Z3A10 10 weeks gestation of pregnancy: Secondary | ICD-10-CM

## 2020-10-20 DIAGNOSIS — Z3401 Encounter for supervision of normal first pregnancy, first trimester: Secondary | ICD-10-CM | POA: Diagnosis not present

## 2020-10-20 DIAGNOSIS — Z3402 Encounter for supervision of normal first pregnancy, second trimester: Secondary | ICD-10-CM

## 2020-10-20 DIAGNOSIS — Z3400 Encounter for supervision of normal first pregnancy, unspecified trimester: Secondary | ICD-10-CM

## 2020-10-20 DIAGNOSIS — Z3A18 18 weeks gestation of pregnancy: Secondary | ICD-10-CM

## 2020-10-20 NOTE — Progress Notes (Signed)
Routine Prenatal Care Visit  Subjective  Nancy Brown is a 24 y.o. G1P0 at [redacted]w[redacted]d being seen today for ongoing prenatal care.  She is currently monitored for the following issues for this low-risk pregnancy and has Disruption of wound; ANA positive; False-positive serological test result; Hand discomfort; Supervision of normal first pregnancy; and COVID-19 affecting pregnancy, antepartum on their problem list.  ----------------------------------------------------------------------------------- Patient reports no complaints.   Contractions: Not present. Vag. Bleeding: None.  Movement: Absent. Denies leaking of fluid.  ----------------------------------------------------------------------------------- The following portions of the patient's history were reviewed and updated as appropriate: allergies, current medications, past family history, past medical history, past social history, past surgical history and problem list. Problem list updated.   Objective  Blood pressure 128/68, weight 148 lb (67.1 kg), last menstrual period 06/13/2020. Pregravid weight Pregravid weight not on file Total Weight Gain Not found. Urinalysis:      Fetal Status: Fetal Heart Rate (bpm): 175   Movement: Absent     General:  Alert, oriented and cooperative. Patient is in no acute distress.  Skin: Skin is warm and dry. No rash noted.   Cardiovascular: Normal heart rate noted  Respiratory: Normal respiratory effort, no problems with respiration noted  Abdomen: Soft, gravid, appropriate for gestational age. Pain/Pressure: Absent     Pelvic:  Cervical exam deferred        Extremities: Normal range of motion.     ental Status: Normal mood and affect. Normal behavior. Normal judgment and thought content.   US OB Comp Less 14 Wks  Result Date: 10/20/2020 Patient Name: Nancy Brown DOB: Dec 05, 1995 MRN: [id number] ULTRASOUND REPORT Location: [facility] Date of Service: 10/20/2020 Indications:dating Findings:  Mason Jim intrauterine pregnancy is visualized with a CRL consistent with [redacted]w[redacted]d gestation, giving an (U/S) EDD of 05/15/2021. The (U/S) EDD is not consistent with the clinically established EDD of 03/20/2021. FHR: 175 BPM CRL measurement: 35.1 mm Yolk sac is visualized and appears normal. Amnion: visualized and appears normal Right Ovary is normal in appearance. Left Ovary is normal appearance. Corpus luteal cyst:  Left ovary Survey of the adnexa demonstrates no adnexal masses. There is no free peritoneal fluid in the cul de sac. Impression: 1. [redacted]w[redacted]d Viable Singleton Intrauterine pregnancy by U/S, EDD: 05/15/2021 2. (U/S) EDD is not consistent with Clinically established EDD of 03/20/2021. Recommendations: 1.Clinical correlation with the patient's History and Physical Exam. Deanna Artis, RT There is a viable singleton gestation.  The fetal biometry does not correlate with established dating. Detailed evaluation of the fetal anatomy is precluded by early gestational age.  It must be noted that a normal ultrasound particular at this early gestational age is unable to rule out fetal aneuploidy, risk of first trimester miscarriage, or anatomic birth defects. Based on ultrasound examination today, EDC changed to 05/15/2021 [place] Criteria for Re-dating Pregnancy Dating Dating Discrepancy [redacted]w[redacted]d >5 days 107w0d - [redacted]w[redacted]d >7 days [redacted]w[redacted]d - [redacted]w[redacted]d >7 days [redacted]w[redacted]d - [redacted]w[redacted]d >10 days [redacted]w[redacted]d - [redacted]w[redacted]d >14 days [redacted]w[redacted]d and above >21 days ACOG Committee Opinion 700 May 2017 "Methods for Estimating the Due Date" Vena Austria, MD, [facility], [facility] 10/20/2020, 3:47 PM      Assessment   24 y.o. G1P0 at [redacted]w[redacted]d by  03/20/2021, by Last Menstrual Period presenting for routine prenatal visit  Plan    Gestational age appropriate obstetric precautions including but not limited to vaginal bleeding, contractions, leaking of fluid and fetal movement were reviewed in detail with the patient.   -  redated based on [place]  today - discussed MaternT21 and Inheritest and given handout.  At this point undecided on genetic testing   Return in about 4 weeks (around 11/17/2020) for ROB.  Vena Austria, MD, [facility], [facility] 10/20/2020, 3:47 PM

## 2020-10-20 NOTE — Patient Instructions (Signed)
This is a very exciting time for you and your family, so congratulations from everybody here at [facility]! You have just embarked on a very amazing journey. The next several months will be filled with wondrous emotions and miraculous memories. As you begin your preparations, this office wanted you to be aware of a few prenatal genetic laboratory tests that are available to you early in your pregnancy. These tests are optional and you may decide to opt out of the testing.   Patients often voice their desire to opt out of this testing as it would not change their decisions on continuing the pregnancy.  However, our providers value the information they receive from these tests as they allow us to optimize the care for you and your baby prior to delivery.    There are 6 genetic laboratory tests this office offers, and they test for a variety of different genetic diseases or chromosomal abnormalities. By utilizing these tests, the providers can better understand your risk associated with passing on a genetic condition to your child. These tests are screening tests, and are not used to diagnose any condition. If one of these tests results abnormal, then a diagnostic test will be offered to you. It is important to remember that most pregnancies will result in a beautiful and healthy baby. Knowing the results of these tests will also help you better prepare for your delivery.  We encourage you to read over the brief descriptions below and engage with your provider regarding any additional questions you may have.  Please also make your provider aware if you or your partner has any Jewish ancestry as there may be additional testing that you qualify for. The tests that will be ordered are: 1. Cystic Fibrosis Carrier Screen1 (Blood Test): The most common autosomal recessive disorder. This disease causes thick mucus to build up in the lungs, which leads to repetitive chest infections. The carrier frequency in caucasians is  roughly 1 in 30 people in the [place], but varies by ethnicity.   2. Spinal Muscular Atrophy Carrier Screen1 (Blood Test):  The second most common autosomal recessive disorder and the most common inherited form of early childhood death. This is degenerative neuromuscular condition that affects the child's ability to sit, smile, breath, swallow, etc. The carrier frequency is the [place] is roughly 1 in 54, but varies by ethnicity. 3. Fragile X Syndrome Carrier Screen1 (Blood Test): The most common form of inherited mental retardation. The severity of the retardation varies. Fragile X is most commonly passed from mother to child, and 1 in 260 people in the [place] are carriers. 4. Downs Syndrome, Trisomy 18: Trisomy 21 ( Downs Syndrome) and Trisomy 18 are common forms of mental retardation due to chromosomal abnormalities. Downs is the milder of the two, and it occurs 1 in 700 live births. Trisomy 18 is a more severe form of mental retardation, and occurs in 1 in 6000 live births. The risk of conceiving a baby with one of these two genetic conditions is independent of family history.  The test offered to you will depend on how far along you are in your pregnancy.  The main risk factor that has been identified as predisposing to either trisomy 21 or trisomy 18 is the age of the mother, with increased risk for those patients over the age of 35 at the time of delivery.  If you are deemed high risk on the initial screening test or are over 35 you may be a   candidate for cell free fetal DNA testing or amniocentesis outlined below.  See #6, 7, 8, and 9 for available testing options 5.  MSAFP2 Maternal Serum Alpha Feto-Protein (Blood Test):  Open Neural Tube Defects deal with an opening in the spinal column that does not close properly during pregnancy. It occurs when the tissues that fold to form the neural tube do not close or do not stay closed completely. This causes an opening in the vertebrae,  which surround and protect the spinal cord. The most common form is Spina Bifida. It occurs in 1 in 1000 live births. The folic acid in prenatal vitamins can decrease the risk of you baby developing this birth defect.  If you have had a prior pregnancy complicated by spina bifida you may need higher doses of folic acid.   6. 1st trimester screening2 (Blood Test):  this test is a combination of an ultrasound measurement of your baby's neck and blood work ideally obtained between 11 weeks and 13 weeks 6 days gestation.  It tests for #4 above 7. Tetra Screen2 (Blood Test):  this is a combined blood test offered at 15 to [redacted] weeks gestation.  It tests for conditions #4 and #5 above 8. Cell Free Fetal DNA2 (Blood Test):  this test is available for our patient over 35 or patient who were found to be at increased risk for down syndrome or trisomy 18 based on either 1st trimester screening results or serum tetra screening.  At present this testing is still considered screening rather than diagnostic 9. Amniocentesis - this test is available for our patient over 35 or patient who were found to be at increased risk for down syndrome or trisomy 18 based on either 1st trimester screening results or serum tetra screening.  This test involves using a needle to draw off some of the fluid from around the baby in order to determine if the fetus is affected by either trisomy 21 (Downs Syndrome) or trisomy 18.  In addition this test may be suggested or offered by your provider in other circumstances.  This test is considered diagnostic as opposed to screening meaning that it can definitively rule in or rule out the tested condition.  Unlike the other testing discussed amniocentesis is an invasive procedure and is associated with a small risk (approximately 1 in 200) of resulting in a miscarriage. 1. It is also important to notate that if you screen positive for carrier status for Cystic Fibrosis or Spinal Muscular Atrophy, that  does not mean your child will be affected with the condition. The child's father will also need to be tested. If both of you are carriers, then there is a 25% that you will have an affected child. The risk of having a child affected with Downs Syndrome or Trisomy 18 varies by maternal age. There is not a "carrier" status for these conditions. If you would like more information of these conditions, please see the handouts in your packet of information. 2. Denotes screening tests.   These tests can assess the risk of the pregnancy being affected by a particular condition.  It is important to note that even if testing deems that you are at increased risk your baby may still be unaffected.  Conversely, test results indicating that your baby is at low risk for the tested condition does not rule out that the baby could still be affected by that condition  

## 2020-10-23 NOTE — L&D Delivery Note (Signed)
Delivery Summary for Nancy Brown  Labor Events:   Preterm labor: No data found  Rupture date: 05/18/2021  Rupture time: 4:19 AM  Rupture type: Artificial  Fluid Color: Clear  Induction: No data found  Augmentation: No data found  Complications: No data found  Cervical ripening: No data found No data found   No data found     Delivery:   Episiotomy: No data found  Lacerations: No data found  Repair suture: No data found  Repair # of packets: No data found  Blood loss (ml): No data found   Information for the patient's newborn:  Nancy, Brown [[id number]]   Delivery 05/18/2021 8:16 AM by  Vaginal, Spontaneous Sex:  female Gestational Age: [redacted]w[redacted]d Delivery Clinician:   Living?:         APGARS  One minute Five minutes Ten minutes  Skin color:        Heart rate:        Grimace:        Muscle tone:        Breathing:        Totals: 2  6  9     Presentation/position:      Resuscitation:   Cord information:    Disposition of cord blood:     Blood gases sent?  Complications:   Placenta: Delivered:       appearance Newborn Measurements: Weight: 5 lb 11 oz (2580 g)  Height: 18.5"  Head circumference:    Chest circumference:    Other providers:    Additional  information: Forceps:   Vacuum:   Breech:   Observed anomalies       Delivery Note At 8:16 AM a viable female " " was delivered via Vaginal, Spontaneous (Presentation: Vertex; ROA position).  APGAR: 2, 6; 9. Weight 5 lb 11 oz (2580 g).   Placenta status: Spontaneous, Intact.  Cord: 3 vessels with the following complications: Nuchal x 2, tight but reducible at perineum.  Cord pH: ordered.   Anesthesia: Epidural Episiotomy: None Lacerations: None Suture Repair:  None Est. Blood Loss (mL):  200 ml  Mom to postpartum.  Baby to Couplet care / Skin to Skin.  Aggie Hacker, MD 05/18/2021  9:10 A.M.

## 2020-11-13 ENCOUNTER — Other Ambulatory Visit: Payer: Self-pay

## 2020-11-13 ENCOUNTER — Emergency Department: Payer: 59

## 2020-11-13 ENCOUNTER — Encounter: Payer: Self-pay | Admitting: Emergency Medicine

## 2020-11-13 DIAGNOSIS — R519 Headache, unspecified: Secondary | ICD-10-CM | POA: Diagnosis not present

## 2020-11-13 DIAGNOSIS — O26892 Other specified pregnancy related conditions, second trimester: Secondary | ICD-10-CM | POA: Diagnosis not present

## 2020-11-13 DIAGNOSIS — Z3A20 20 weeks gestation of pregnancy: Secondary | ICD-10-CM | POA: Diagnosis not present

## 2020-11-13 DIAGNOSIS — O99891 Other specified diseases and conditions complicating pregnancy: Secondary | ICD-10-CM | POA: Insufficient documentation

## 2020-11-13 DIAGNOSIS — R0602 Shortness of breath: Secondary | ICD-10-CM | POA: Insufficient documentation

## 2020-11-13 DIAGNOSIS — R110 Nausea: Secondary | ICD-10-CM | POA: Insufficient documentation

## 2020-11-13 DIAGNOSIS — Z5321 Procedure and treatment not carried out due to patient leaving prior to being seen by health care provider: Secondary | ICD-10-CM | POA: Insufficient documentation

## 2020-11-13 DIAGNOSIS — R109 Unspecified abdominal pain: Secondary | ICD-10-CM | POA: Diagnosis not present

## 2020-11-13 LAB — BASIC METABOLIC PANEL
Anion gap: 11 (ref 5–15)
BUN: 14 mg/dL (ref 6–20)
CO2: 20 mmol/L — ABNORMAL LOW (ref 22–32)
Calcium: 9.7 mg/dL (ref 8.9–10.3)
Chloride: 103 mmol/L (ref 98–111)
Creatinine, Ser: 0.46 mg/dL (ref 0.44–1.00)
GFR, Estimated: >60 mL/min (ref >60)
Glucose, Bld: 98 mg/dL (ref 70–99)
Potassium: 3.7 mmol/L (ref 3.5–5.1)
Sodium: 134 mmol/L — ABNORMAL LOW (ref 135–145)

## 2020-11-13 LAB — TROPONIN I (HIGH SENSITIVITY): Troponin I (High Sensitivity): <2 ng/L (ref <18)

## 2020-11-13 LAB — CBC
HCT: 36.9 % (ref 36.0–46.0)
Hemoglobin: 12.8 g/dL (ref 12.0–15.0)
MCH: 29.1 pg (ref 26.0–34.0)
MCHC: 34.7 g/dL (ref 30.0–36.0)
MCV: 83.9 fL (ref 80.0–100.0)
Platelets: 258 10*3/uL (ref 150–400)
RBC: 4.4 MIL/uL (ref 3.87–5.11)
RDW: 13.3 % (ref 11.5–15.5)
WBC: 9.5 10*3/uL (ref 4.0–10.5)
nRBC: 0 % (ref 0.0–0.2)

## 2020-11-13 NOTE — ED Triage Notes (Signed)
Patient with complaint of shortness of breath, nausea, headache and abdominal discomfort that started this evening. Patient states that she is approximately [redacted] weeks pregnant.

## 2020-11-13 NOTE — ED Notes (Signed)
Patient to waiting room via wheelchair by EMS.  Per EMS patient with nausea, headache, shortness of breath and pain around navel area for the past 3-4 hours.  EMS interventions -- p 104, bp 155/91, pulse oxi 100% on room air, cbg 110.

## 2020-11-14 ENCOUNTER — Other Ambulatory Visit: Payer: Self-pay

## 2020-11-14 ENCOUNTER — Encounter: Payer: Self-pay | Admitting: Emergency Medicine

## 2020-11-14 ENCOUNTER — Ambulatory Visit (INDEPENDENT_AMBULATORY_CARE_PROVIDER_SITE_OTHER)
Admission: EM | Admit: 2020-11-14 | Discharge: 2020-11-14 | Disposition: A | Discharge status: Home / Self Care | Payer: 59 | Source: Home / Self Care

## 2020-11-14 ENCOUNTER — Emergency Department
Admission: EM | Admit: 2020-11-14 | Discharge: 2020-11-14 | Disposition: A | Discharge status: Home / Self Care | Payer: 59 | Attending: Emergency Medicine | Admitting: Emergency Medicine

## 2020-11-14 DIAGNOSIS — Z3492 Encounter for supervision of normal pregnancy, unspecified, second trimester: Secondary | ICD-10-CM

## 2020-11-14 DIAGNOSIS — A599 Trichomoniasis, unspecified: Secondary | ICD-10-CM

## 2020-11-14 DIAGNOSIS — R609 Edema, unspecified: Secondary | ICD-10-CM | POA: Insufficient documentation

## 2020-11-14 DIAGNOSIS — O09212 Supervision of pregnancy with history of pre-term labor, second trimester: Secondary | ICD-10-CM

## 2020-11-14 DIAGNOSIS — R0602 Shortness of breath: Secondary | ICD-10-CM

## 2020-11-14 LAB — URINALYSIS, COMPLETE (UACMP) WITH MICROSCOPIC
Bilirubin Urine: NEGATIVE
Glucose, UA: NEGATIVE mg/dL
Hgb urine dipstick: NEGATIVE
Ketones, ur: NEGATIVE mg/dL
Nitrite: NEGATIVE
Protein, ur: NEGATIVE mg/dL
Specific Gravity, Urine: 1.025 (ref 1.005–1.030)
pH: 7 (ref 5.0–8.0)

## 2020-11-14 MED ORDER — METRONIDAZOLE 500 MG PO TABS: 500.0000 mg | ORAL_TABLET | Freq: Two times a day (BID) | ORAL | 0 refills | Status: AC

## 2020-11-14 NOTE — Discharge Instructions (Addendum)
-  You tested positive for trichomonas again. Do not have intercourse until 1 week after you and your partner have completed treatment  -All other vital signs are normal in the clinic today.  Your chest is clear when I listen to you.  Your lab work from yesterday was normal and your chest x-ray was normal.  I suspect that your shortness of breath is likely functional shortness of breath due to pregnancy, but as we discussed there are many other causes of shortness of breath and if your symptoms worsen she will need to go back to the ED or [facility].  See the contact information below.  Keep your appointment with your OB/gyne in the next couple of days.  -Go to [facility] if shortness of breath or other symptoms worsen Address: [address] [place], [place], [place] [address] Hours: Open 24 hours Phone: [phone]

## 2020-11-14 NOTE — ED Triage Notes (Signed)
Patient in today c/o bilateral le edema, sob and back pain x 3 days, worse last night. Patient is ~[redacted] weeks pregnant. Patient went to [facility] ED last night and had blood work, but left without being seen. Patient also having nausea. GYN sent in Zofran in December 2021, but patient never picked up. Patient has appt with OB/GYN on 11/18/19.

## 2020-11-14 NOTE — ED Provider Notes (Signed)
[facility]    CSN: [id number] Arrival date & time: 11/14/20  1121      History   Chief Complaint Chief Complaint  Patient presents with  . Leg Swelling  . Back Pain  . Shortness of Breath    HPI Nancy Brown is a 25 y.o. female presenting for 3 day history of lower back pain. She is [redacted] weeks pregnant. Also admits to feeling short of breath with ambulation since yesterday. Patient says she has intermittent swelling of her legs and feet that was present before pregnancy but seems to be worse now. Patient did go to the ED last night for these complaints and had multiple labs and a chest x-ray performed. She left before being seen by the provider due to 6 hr + wait time. Patient does have appt with her OB/GYN in 3 days, but wanted to be seen sooner and also requests a work note today.  Patient reports +COVID 19 history in December 2021. She says she fully recovered and denies fever, cough, congestion, headaches, body aches, chest pain, palpitations, abdominal pain, dysuria. Patient also has positive history of trichomonas in December 2021. She says she was prescribed medication to treat it, but vomited most of the medication up and never got a refill. She also says her partner did not get treated. She says she did not know he needed to be treated. She denies vaginal discharge, pelvic pain, abnormal vaginal bleeding. She has no other concerns to report today.  HPI  Past Medical History:  Diagnosis Date  . Hidradenitis axillaris    surgically removed in 2016.    Patient Active Problem List   Diagnosis Date Noted  . COVID-19 affecting pregnancy, antepartum 10/07/2020  . Supervision of normal first pregnancy 09/29/2020  . 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 ANA positive 10/21/2015  . Hand discomfort 10/21/2015  . Disruption of wound 12/16/2014    Past Surgical History:  Procedure Laterality Date  . AXILLARY HIDRADENITIS EXCISION Bilateral 12-04-14  .  CYST EXCISION Bilateral    side of ears    OB History    Gravida  1   Para      Term      Preterm      AB      Living        SAB      IAB      Ectopic      Multiple      Live Births               Home Medications    Prior to Admission medications   Medication Sig Start Date End Date Taking? Authorizing Provider  metroNIDAZOLE (FLAGYL) 500 MG tablet Take 1 tablet (500 mg total) by mouth 2 (two) times daily for 7 days. 11/14/20 11/21/20 Yes Shirlee LatchEaves, Terril Amaro B, PA-C  Prenatal Vit-Fe Fumarate-FA (PRENATAL VITAMIN PO) Take 1 tablet by mouth daily.   Yes [provider]  ibuprofen (ADVIL,MOTRIN) 600 MG tablet Take 1 tablet (600 mg total) by mouth every 6 (six) hours as needed. 09/18/18   Minna AntisPaduchowski, Kevin, MD    Family History Family History  Problem Relation Age of Onset  . Hypertension Mother   . Asthma Mother   . Thyroid disease Mother   . Hyperlipidemia Mother   . Other Father        "gum disease"    Social History Social History   Tobacco Use  . Smoking status: Never Smoker  . Smokeless  tobacco: Never Used  Vaping Use  . Vaping Use: Never used  Substance Use Topics  . Alcohol use: No  . Drug use: No     Allergies   Patient has no known allergies.   Review of Systems Review of Systems  Constitutional: Positive for fatigue. Negative for chills, diaphoresis and fever.  HENT: Negative for congestion, rhinorrhea and sore throat.   Respiratory: Positive for shortness of breath. Negative for cough.   Cardiovascular: Positive for leg swelling. Negative for chest pain and palpitations.  Gastrointestinal: Positive for nausea and vomiting. Negative for abdominal pain.  Genitourinary: Negative for difficulty urinating, dysuria and vaginal discharge.  Musculoskeletal: Positive for back pain (lower back). Negative for arthralgias and myalgias.  Skin: Negative for rash.  Neurological: Negative for weakness and headaches.     Physical  Exam Triage Vital Signs ED Triage Vitals  Enc Vitals Group     BP 11/14/20 1211 131/76     Pulse Rate 11/14/20 1211 76     Resp 11/14/20 1211 18     Temp 11/14/20 1211 98.3 F (36.8 C)     Temp Source 11/14/20 1211 Oral     SpO2 11/14/20 1211 100 %     Weight 11/14/20 1212 150 lb (68 kg)     Height 11/14/20 1212 5\' 5"  (1.651 m)     Head Circumference --      Peak Flow --      Pain Score 11/14/20 1211 6     Pain Loc --      Pain Edu? --      Excl. in GC? --    No data found.  Updated Vital Signs BP 131/76 (BP Location: Left Arm)   Pulse 76   Temp 98.3 F (36.8 C) (Oral)   Resp 18   Ht 5\' 5"  (1.651 m)   Wt 150 lb (68 kg)   LMP 06/13/2020   SpO2 100%   BMI 24.96 kg/m    Physical Exam Vitals and nursing note reviewed.  Constitutional:      General: She is not in acute distress.    Appearance: Normal appearance. She is not ill-appearing, toxic-appearing or diaphoretic.  HENT:     Head: Normocephalic and atraumatic.     Nose: Nose normal.     Mouth/Throat:     Mouth: Mucous membranes are moist.     Pharynx: Oropharynx is clear.  Eyes:     General: No scleral icterus.       Right eye: No discharge.        Left eye: No discharge.     Conjunctiva/sclera: Conjunctivae normal.  Cardiovascular:     Rate and Rhythm: Normal rate and regular rhythm.     Heart sounds: Normal heart sounds.  Pulmonary:     Effort: Pulmonary effort is normal. No respiratory distress.     Breath sounds: Normal breath sounds. No wheezing, rhonchi or rales.  Abdominal:     Palpations: Abdomen is soft.     Tenderness: There is abdominal tenderness (mild diffuse TTP). There is no guarding.     Comments: Fundus at umbilicus  Musculoskeletal:     Cervical back: Neck supple.     Right lower leg: Edema (mild non pitting edema bilateral dorsal feet and ankles) present.     Left lower leg: Edema (mild TTP distal lower leg, posterior ankle) present.  Skin:    General: Skin is dry.  Neurological:      General: No focal deficit  present.     Mental Status: She is alert. Mental status is at baseline.     Motor: No weakness.     Gait: Gait normal.  Psychiatric:        Mood and Affect: Mood normal.        Behavior: Behavior normal.        Thought Content: Thought content normal.      UC Treatments / Results  Labs (all labs ordered are listed, but only abnormal results are displayed) Labs Reviewed  URINE CULTURE - Abnormal; Notable for the following components:      Result Value   Culture MULTIPLE SPECIES PRESENT, SUGGEST RECOLLECTION (*)    All other components within normal limits  URINALYSIS, COMPLETE (UACMP) WITH MICROSCOPIC - Abnormal; Notable for the following components:   APPearance HAZY (*)    Leukocytes,Ua MODERATE (*)    Bacteria, UA FEW (*)    Trichomonas, UA PRESENT (*)    All other components within normal limits    EKG   Radiology No results found.  Procedures Procedures (including critical care time)  Medications Ordered in UC Medications - No data to display  Initial Impression / Assessment and Plan / UC Course  I have reviewed the triage vital signs and the nursing notes.  Pertinent labs & imaging results that were available during my care of the patient were reviewed by me and considered in my medical decision making (see chart for details).    25 y/o [redacted] week pregnant female presenting for multiple complaints: bilateral leg swelling, lower back pain and SOB.   All VSS and patient in no acute distress. Chest is CTA and heart RRR. Mild bilateral lower leg edema with mild TTP of left ankle. She does have mild TTP of abdomen.  I reviewed notes and labs from patient's [facility] ED visit. CXR interpreted as normal. Labs--CBC, BMP and troponin normal.   UA today significant for moderate leuks and trichomonas present. Will send urine for culture and treat for UTI if positive. Re-treating with metronidazole and advised her to have partner seen and treated as  well. Keep appt with OB/GYN and take the zofran as prescribed for n/v.  I believe all of her complaints (leg swelling, back pain, SOB) are related to normal pregnancy. No red flags noted today. Discussed supportive care. ED precautions reviewed and advised to go to ED if symptoms change or worsen. Work note given.   Final Clinical Impressions(s) / UC Diagnoses   Final diagnoses:  Shortness of breath  Trichomonas infection  Second trimester pregnancy  Peripheral edema     Discharge Instructions     -You tested positive for trichomonas again. Do not have intercourse until 1 week after you and your partner have completed treatment  -All other vital signs are normal in the clinic today.  Your chest is clear when I listen to you.  Your lab work from yesterday was normal and your chest x-ray was normal.  I suspect that your shortness of breath is likely functional shortness of breath due to pregnancy, but as we discussed there are many other causes of shortness of breath and if your symptoms worsen he will need to go back to the ED [facility].  See the contact information below.  Keep your appointment with your OB/gyne in the next couple of days.  -Go to [facility] if shortness of breath or other symptoms worsen Address: [address]  [address] [place], [place], [place] [address] Hours: Open 24 hours Phone: [phone]    ED Prescriptions    Medication Sig Dispense Auth. Provider   metroNIDAZOLE (FLAGYL) 500 MG tablet Take 1 tablet (500 mg total) by mouth 2 (two) times daily for 7 days. 14 tablet Gareth Morgan     PDMP not reviewed this encounter.   Shirlee Latch, PA-C 11/16/20 1948

## 2020-11-15 LAB — URINE CULTURE

## 2020-11-17 ENCOUNTER — Encounter: Payer: Self-pay | Admitting: Obstetrics and Gynecology

## 2020-11-17 ENCOUNTER — Ambulatory Visit (INDEPENDENT_AMBULATORY_CARE_PROVIDER_SITE_OTHER): Payer: 59 | Admitting: Obstetrics and Gynecology

## 2020-11-17 ENCOUNTER — Other Ambulatory Visit: Payer: Self-pay

## 2020-11-17 VITALS — BP 118/78 | Wt 152.0 lb

## 2020-11-17 DIAGNOSIS — Z113 Encounter for screening for infections with a predominantly sexual mode of transmission: Secondary | ICD-10-CM

## 2020-11-17 DIAGNOSIS — Z3400 Encounter for supervision of normal first pregnancy, unspecified trimester: Secondary | ICD-10-CM

## 2020-11-17 DIAGNOSIS — Z1379 Encounter for other screening for genetic and chromosomal anomalies: Secondary | ICD-10-CM

## 2020-11-17 DIAGNOSIS — Z3402 Encounter for supervision of normal first pregnancy, second trimester: Secondary | ICD-10-CM

## 2020-11-17 DIAGNOSIS — Z3A22 22 weeks gestation of pregnancy: Secondary | ICD-10-CM

## 2020-11-17 LAB — POCT URINALYSIS DIPSTICK OB
Glucose, UA: NEGATIVE
POC,PROTEIN,UA: NEGATIVE

## 2020-11-17 NOTE — Progress Notes (Signed)
Routine Prenatal Care Visit  Subjective  Nancy Brown is a 25 y.o. G1P0 at [redacted]w[redacted]d being seen today for ongoing prenatal care.  She is currently monitored for the following issues for this low-risk pregnancy and has Disruption of wound; ANA positive; False-positive serological test result; Hand discomfort; Supervision of normal first pregnancy; and COVID-19 affecting pregnancy, antepartum on their problem list.  ----------------------------------------------------------------------------------- Patient reports no complaints today. States she went to the ER over the weekend for nausea and back pain, but left after an extended wait time. Patient reports she has just started taking tx for trich dx 09/29/20. She was unable to complete her treatment while she had COVID-19. Contractions: Not present. Vag. Bleeding: None.  Movement: Absent. Denies leaking of fluid.  ----------------------------------------------------------------------------------- The following portions of the patient's history were reviewed and updated as appropriate: allergies, current medications, past family history, past medical history, past social history, past surgical history and problem list. Problem list updated.   Objective  Blood pressure 118/78, weight 152 lb (68.9 kg), last menstrual period 06/13/2020. Pregravid weight Pregravid weight not on file Total Weight Gain Not found. Urinalysis:      Fetal Status: Fetal Heart Rate (bpm): 160   Movement: Absent     General:  Alert, oriented and cooperative. Patient is in no acute distress.  Skin: Skin is warm and dry. No rash noted.   Cardiovascular: Normal heart rate noted  Respiratory: Normal respiratory effort, no problems with respiration noted  Abdomen: Soft, gravid, appropriate for gestational age. Pain/Pressure: Absent     Pelvic:  Cervical exam deferred        Extremities: Normal range of motion.     ental Status: Normal mood and affect. Normal behavior. Normal  judgment and thought content.     Assessment   25 y.o. G1P0 at [redacted]w[redacted]d by  05/15/2021, by Ultrasound presenting for routine prenatal visit  Plan   pregnancy 1 Problems (from 09/29/20 to present)    Problem Noted Resolved   COVID-19 affecting pregnancy, antepartum 10/07/2020 by Conard Novak, MD No   Supervision of normal first pregnancy 09/29/2020 by Tresea Mall, CNM No   Overview Addendum 10/20/2020  7:39 PM by Vena Austria, MD    Clinic [facility] Prenatal Labs  Dating 10 week [place] Blood type:     Genetic Screen 1 Screen:    AFP:     Quad:     NIPS: Antibody:   Anatomic [place]  Rubella:    Varicella: @VZVIGG @  GTT Early: NA               Third trimester:  RPR:     Rhogam  HBsAg:     Vaccines TDAP:                       Flu Shot: Covid: HIV:     Baby Food                                GBS:   GC/CT:  Contraception  Pap: 09/29/20  CBB     CS/VBAC NA   Support Person Adam          Previous Version      -Patient needs TOC for trich infection - however just started Rx for tx. Will test in 4 weeks -NOB labs collected today -Patient confused about dating process following 14/8/21 10/20/20. Reviewed process for dating criteria for LMP  vs [place]. Patient stated understanding. Confirmed that at today's visit patient is 14w3 based on 10w [place], with an EDD of 05/15/21.   Second trimester precautions including but not limited to vaginal bleeding, contractions, leaking of fluid and fetal movement were reviewed in detail with the patient.    Return in about 4 weeks (around 12/15/2020) for ROB with anat scan.  Nancy Brown, CNM, MSN [facility], [facility] 11/17/2020, 4:22 PM

## 2020-11-20 LAB — RPR+RH+ABO+RUB AB+AB SCR+CB...
Antibody Screen: NEGATIVE
HIV Screen 4th Generation wRfx: NONREACTIVE
Hematocrit: 38.9 % (ref 34.0–46.6)
Hemoglobin: 13.2 g/dL (ref 11.1–15.9)
Hepatitis B Surface Ag: NEGATIVE
MCH: 28.6 pg (ref 26.6–33.0)
MCHC: 33.9 g/dL (ref 31.5–35.7)
MCV: 84 fL (ref 79–97)
Platelets: 281 10*3/uL (ref 150–450)
RBC: 4.61 x10E6/uL (ref 3.77–5.28)
RDW: 13 % (ref 11.7–15.4)
RPR Ser Ql: NONREACTIVE
Rh Factor: POSITIVE
Rubella Antibodies, IGG: 15.1 index (ref >0.99)
Varicella zoster IgG: 1356 index (ref >165)
WBC: 9.9 10*3/uL (ref 3.4–10.8)

## 2020-11-20 LAB — HGB FRACTIONATION CASCADE
Hgb A2: 2.6 % (ref 1.8–3.2)
Hgb A: 97.4 % (ref 96.4–98.8)
Hgb F: 0 % (ref 0.0–2.0)
Hgb S: 0 %

## 2020-11-24 ENCOUNTER — Encounter: Payer: 59 | Admitting: Obstetrics and Gynecology

## 2020-11-24 ENCOUNTER — Other Ambulatory Visit: Payer: Self-pay

## 2020-11-24 ENCOUNTER — Ambulatory Visit (INDEPENDENT_AMBULATORY_CARE_PROVIDER_SITE_OTHER): Payer: 59 | Admitting: Obstetrics and Gynecology

## 2020-11-24 ENCOUNTER — Encounter: Payer: Self-pay | Admitting: Obstetrics and Gynecology

## 2020-11-24 VITALS — BP 136/84 | HR 93 | Wt 153.6 lb

## 2020-11-24 DIAGNOSIS — Z7689 Persons encountering health services in other specified circumstances: Secondary | ICD-10-CM

## 2020-11-24 DIAGNOSIS — Z3A15 15 weeks gestation of pregnancy: Secondary | ICD-10-CM

## 2020-11-24 DIAGNOSIS — Z3402 Encounter for supervision of normal first pregnancy, second trimester: Secondary | ICD-10-CM | POA: Diagnosis not present

## 2020-11-24 NOTE — Progress Notes (Signed)
Patient presents today to establish care for her pregnancy.  She is a new patient to our practice.  She was previously seen at [facility] and was unhappy with their care.  I have reviewed her lab work and previous ultrasound.  I discussed ultrasounds in detail with the patient and explained to her the necessity of her due date being 05/15/2021.  This makes her approximately 15 weeks estimated gestational age.  She is currently taking prenatal vitamins.  She has no pregnancy related complaints. After discussion the patient has chosen to have genetic testing and AFP performed today. I have scheduled her for a fetal anatomic survey ultrasound at her next visit. Prenatal Plan 1.  The patient was given prenatal literature. 2.  She was continued on prenatal vitamins. 3.  A prenatal lab panel was reviewed. 4.  An ultrasound was ordered for FAS next visit. 5.  Her Pap smear was reviewed.  Patient should have follow-up check for trichomoniasis at next visit. 6.  Genetic testing and testing for other inheritable conditions discussed in detail. She has elected to have MaterniT 21 and AFP done today. 7.  A general overview of pregnancy testing, visit schedule, ultrasound schedule, and prenatal care was discussed. 8.  COVID and its risks associated with pregnancy, prevention by limiting exposure and use of masks, as well as the risks and benefits of vaccination during pregnancy were discussed in detail.  [facility] policy regarding office and hospital visitation and testing was explained. 9.  Benefits of breast-feeding discussed in detail including both maternal and infant benefits. Ready Set Baby website discussed.   I spent 31 minutes involved in the care of this patient preparing to see the patient by obtaining and reviewing her medical history (including labs, imaging tests and prior procedures), documenting clinical information in the electronic health record (EHR), counseling and coordinating care plans, writing and  sending prescriptions, ordering tests or procedures and directly communicating with the patient by discussing pertinent items from her history and physical exam as well as detailing my assessment and plan as noted above so that she has an informed understanding.  All of her questions were answered.

## 2020-11-26 LAB — AFP, SERUM, OPEN SPINA BIFIDA
AFP MoM: 1.01
AFP Value: 32.3 ng/mL
Gest. Age on Collection Date: 15 weeks
Maternal Age At EDD: 26.4 yr
OSBR Risk 1 IN: 10000
Test Results:: NEGATIVE
Weight: 153 [lb_av]

## 2020-11-28 LAB — MATERNIT21  PLUS CORE+ESS+SCA, BLOOD

## 2020-11-30 ENCOUNTER — Telehealth: Payer: Self-pay

## 2020-11-30 NOTE — Telephone Encounter (Signed)
New message   Patient is requesting Zofran to be called in    [facility] in [place]

## 2020-12-01 MED ORDER — ONDANSETRON HCL 4 MG PO TABS
4.0000 mg | ORAL_TABLET | Freq: Three times a day (TID) | ORAL | 0 refills | Status: DC | PRN
Start: 2020-12-01 — End: 2020-12-29

## 2020-12-01 NOTE — Telephone Encounter (Signed)
Sent pt a mychart message informing her that Zofran had been sent to her pharmacy.

## 2020-12-15 ENCOUNTER — Ambulatory Visit: Payer: Self-pay

## 2020-12-15 ENCOUNTER — Encounter: Payer: Self-pay | Admitting: Obstetrics and Gynecology

## 2020-12-15 DIAGNOSIS — Z3402 Encounter for supervision of normal first pregnancy, second trimester: Secondary | ICD-10-CM

## 2020-12-21 ENCOUNTER — Encounter: Payer: Self-pay | Admitting: Obstetrics and Gynecology

## 2020-12-28 ENCOUNTER — Encounter: Payer: Self-pay | Admitting: Obstetrics and Gynecology

## 2020-12-28 ENCOUNTER — Other Ambulatory Visit: Payer: 59

## 2020-12-28 ENCOUNTER — Other Ambulatory Visit (HOSPITAL_COMMUNITY)
Admission: RE | Admit: 2020-12-28 | Discharge: 2020-12-28 | Disposition: A | Discharge status: Home / Self Care | Payer: 59 | Source: Ambulatory Visit | Attending: Obstetrics and Gynecology | Admitting: Obstetrics and Gynecology

## 2020-12-28 ENCOUNTER — Other Ambulatory Visit: Payer: Self-pay

## 2020-12-28 ENCOUNTER — Ambulatory Visit (INDEPENDENT_AMBULATORY_CARE_PROVIDER_SITE_OTHER): Payer: 59 | Admitting: Obstetrics and Gynecology

## 2020-12-28 VITALS — BP 130/75 | HR 80 | Wt 156.7 lb

## 2020-12-28 DIAGNOSIS — Z3A20 20 weeks gestation of pregnancy: Secondary | ICD-10-CM

## 2020-12-28 DIAGNOSIS — Z3402 Encounter for supervision of normal first pregnancy, second trimester: Secondary | ICD-10-CM

## 2020-12-28 DIAGNOSIS — Z8619 Personal history of other infectious and parasitic diseases: Secondary | ICD-10-CM

## 2020-12-28 LAB — POCT URINALYSIS DIPSTICK OB
Bilirubin, UA: NEGATIVE
Blood, UA: NEGATIVE
Glucose, UA: NEGATIVE
Ketones, UA: NEGATIVE
Leukocytes, UA: NEGATIVE
Nitrite, UA: NEGATIVE
POC,PROTEIN,UA: NEGATIVE
Spec Grav, UA: 1.025 (ref 1.010–1.025)
Urobilinogen, UA: 0.2 E.U./dL
pH, UA: 6.5 (ref 5.0–8.0)

## 2020-12-28 NOTE — Patient Instructions (Addendum)

## 2020-12-28 NOTE — Progress Notes (Signed)
OB-Pt present for routine prenatal care. Pt stated that she was doing well.  

## 2020-12-28 NOTE — Progress Notes (Unsigned)
ROB: Patient doing well, no major complaints. Patient has questions again regarding the establishment of her due date. Discussed her LMP vs ultrasound, is dated by [employer]. Answered all questions.  Patient is due for TOC for trichomoniasis.  Will perform today. Declined flu vaccine. For anatomy scan at the end of the week. Discussed breastfeeding. Notes that she may consider. RTC in 4 weeks.

## 2020-12-29 ENCOUNTER — Encounter: Payer: Self-pay | Admitting: Surgical

## 2020-12-29 ENCOUNTER — Encounter: Payer: Self-pay | Admitting: Obstetrics and Gynecology

## 2020-12-29 ENCOUNTER — Other Ambulatory Visit: Payer: Self-pay

## 2020-12-29 MED ORDER — ONDANSETRON HCL 4 MG PO TABS: 4.0000 mg | ORAL_TABLET | Freq: Three times a day (TID) | ORAL | 0 refills | Status: DC | PRN

## 2020-12-30 LAB — CERVICOVAGINAL ANCILLARY ONLY
Comment: NEGATIVE
Trichomonas: NEGATIVE

## 2020-12-31 ENCOUNTER — Ambulatory Visit
Admission: RE | Admit: 2020-12-31 | Discharge: 2020-12-31 | Disposition: A | Discharge status: Home / Self Care | Payer: 59 | Source: Ambulatory Visit | Attending: Obstetrics and Gynecology | Admitting: Obstetrics and Gynecology

## 2020-12-31 ENCOUNTER — Other Ambulatory Visit: Payer: Self-pay

## 2020-12-31 DIAGNOSIS — Z3402 Encounter for supervision of normal first pregnancy, second trimester: Secondary | ICD-10-CM | POA: Diagnosis present

## 2021-01-17 ENCOUNTER — Encounter: Payer: Self-pay | Admitting: Emergency Medicine

## 2021-01-17 ENCOUNTER — Other Ambulatory Visit: Payer: Self-pay

## 2021-01-17 ENCOUNTER — Emergency Department
Admission: EM | Admit: 2021-01-17 | Discharge: 2021-01-17 | Disposition: A | Discharge status: Home / Self Care | Payer: 59 | Attending: Emergency Medicine | Admitting: Emergency Medicine

## 2021-01-17 DIAGNOSIS — O26892 Other specified pregnancy related conditions, second trimester: Secondary | ICD-10-CM | POA: Diagnosis not present

## 2021-01-17 DIAGNOSIS — Z8616 Personal history of COVID-19: Secondary | ICD-10-CM | POA: Diagnosis not present

## 2021-01-17 DIAGNOSIS — R519 Headache, unspecified: Secondary | ICD-10-CM | POA: Diagnosis not present

## 2021-01-17 DIAGNOSIS — R42 Dizziness and giddiness: Secondary | ICD-10-CM | POA: Insufficient documentation

## 2021-01-17 DIAGNOSIS — Z3A24 24 weeks gestation of pregnancy: Secondary | ICD-10-CM | POA: Diagnosis not present

## 2021-01-17 LAB — URINALYSIS, COMPLETE (UACMP) WITH MICROSCOPIC
Bilirubin Urine: NEGATIVE
Glucose, UA: NEGATIVE mg/dL
Hgb urine dipstick: NEGATIVE
Ketones, ur: NEGATIVE mg/dL
Nitrite: NEGATIVE
Protein, ur: NEGATIVE mg/dL
Specific Gravity, Urine: 1.009 (ref 1.005–1.030)
pH: 6 (ref 5.0–8.0)

## 2021-01-17 LAB — CBC WITH DIFFERENTIAL/PLATELET
Abs Immature Granulocytes: 0.07 10*3/uL (ref 0.00–0.07)
Basophils Absolute: 0 10*3/uL (ref 0.0–0.1)
Basophils Relative: 0 %
Eosinophils Absolute: 0.1 10*3/uL (ref 0.0–0.5)
Eosinophils Relative: 1 %
HCT: 36.3 % (ref 36.0–46.0)
Hemoglobin: 12.3 g/dL (ref 12.0–15.0)
Immature Granulocytes: 1 %
Lymphocytes Relative: 15 %
Lymphs Abs: 1.4 10*3/uL (ref 0.7–4.0)
MCH: 29.4 pg (ref 26.0–34.0)
MCHC: 33.9 g/dL (ref 30.0–36.0)
MCV: 86.8 fL (ref 80.0–100.0)
Monocytes Absolute: 0.7 10*3/uL (ref 0.1–1.0)
Monocytes Relative: 8 %
Neutro Abs: 7.1 10*3/uL (ref 1.7–7.7)
Neutrophils Relative %: 75 %
Platelets: 248 10*3/uL (ref 150–400)
RBC: 4.18 MIL/uL (ref 3.87–5.11)
RDW: 13.4 % (ref 11.5–15.5)
WBC: 9.4 10*3/uL (ref 4.0–10.5)
nRBC: 0 % (ref 0.0–0.2)

## 2021-01-17 LAB — BASIC METABOLIC PANEL
Anion gap: 7 (ref 5–15)
BUN: 8 mg/dL (ref 6–20)
CO2: 22 mmol/L (ref 22–32)
Calcium: 9 mg/dL (ref 8.9–10.3)
Chloride: 105 mmol/L (ref 98–111)
Creatinine, Ser: 0.48 mg/dL (ref 0.44–1.00)
GFR, Estimated: >60 mL/min (ref >60)
Glucose, Bld: 73 mg/dL (ref 70–99)
Potassium: 3.3 mmol/L — ABNORMAL LOW (ref 3.5–5.1)
Sodium: 134 mmol/L — ABNORMAL LOW (ref 135–145)

## 2021-01-17 MED ORDER — LACTATED RINGERS IV BOLUS
1000.0000 mL | Freq: Once | INTRAVENOUS | Status: AC
  Administered 2021-01-17: 1000 mL via INTRAVENOUS

## 2021-01-17 MED ORDER — POTASSIUM CHLORIDE CRYS ER 20 MEQ PO TBCR
20.0000 meq | EXTENDED_RELEASE_TABLET | Freq: Once | ORAL | Status: AC
  Administered 2021-01-17: 20 meq via ORAL
  Filled 2021-01-17: qty 1

## 2021-01-17 MED ORDER — SODIUM CHLORIDE 0.9 % IV SOLN
12.5000 mg | Freq: Once | INTRAVENOUS | Status: AC
  Administered 2021-01-17: 12.5 mg via INTRAVENOUS
  Filled 2021-01-17: qty 0.5

## 2021-01-17 NOTE — ED Notes (Signed)
Pt to ED c/o R temporal HA (since 3d) and high BP (151/82) that was taken this morning at work. Denies RUQ pain. BP currently 128/72. States has been seeing "little blue lights" since 2d. States has been "wheezing" since 1.5 weeks. Has OB appt 4/5. Currently [redacted] weeks pregnant. Bb is moving normally.

## 2021-01-17 NOTE — ED Notes (Signed)
E sig pad not working. Paper discharge consent signed.

## 2021-01-17 NOTE — ED Triage Notes (Signed)
Blood pressure elevated.  Patient states her head is throbbing and [redacted] weeks pregnant.  Also c/o 'tasting blood' x 2 days.  AAOx3. Skin warm and dry. NAD

## 2021-01-17 NOTE — ED Notes (Signed)
Pharmacy is mixing phenergan and will send.

## 2021-01-17 NOTE — ED Provider Notes (Signed)
[facility] Emergency Department Provider Note   ____________________________________________   Event Date/Time   First MD Initiated Contact with Patient 01/17/21 [phone]     (approximate)  I have reviewed the triage vital signs and the nursing notes.   HISTORY  Chief Complaint Hypertension    HPI Nancy Brown is a 25 y.o. female, G1 P0 at approximately 24 weeks of pregnancy, who presents to the ED complaining of headache.  Patient reports that she has been feeling slightly weak and malaise for the past couple of days, when she woke up this morning she was dealing with a diffuse throbbing headache.  She has had similar headaches intermittently over the past couple of days.  Headache gradually worsened through the course of this morning and she started feeling lightheaded.  When she arrived to work, they checked her blood pressure and found it to be 151/82.  Patient has never been diagnosed with hypertension and states she does not take any medicines for her blood pressure.  [facility] for [facility] care, denies any issues with this pregnancy thus far.  She has not had any abdominal pain, nausea, vomiting, dysuria, hematuria, vaginal bleeding, or discharge.        Past Medical History:  Diagnosis Date  . Hidradenitis axillaris    surgically removed in 2016.    Patient Active Problem List   Diagnosis Date Noted  . COVID-19 affecting pregnancy, antepartum 10/07/2020  . Supervision of normal first pregnancy 09/29/2020  . 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 ANA positive 10/21/2015  . Hand discomfort 10/21/2015  . Disruption of wound 12/16/2014    Past Surgical History:  Procedure Laterality Date  . AXILLARY HIDRADENITIS EXCISION Bilateral 12-04-14  . CYST EXCISION Bilateral    side of ears    Prior to Admission medications   Medication Sig Start Date End Date Taking? Authorizing Provider  acetaminophen (TYLENOL) 500  MG tablet Take 500-1,000 mg by mouth every 6 (six) hours as needed for mild pain or moderate pain.   Yes [provider]  ondansetron (ZOFRAN) 4 MG tablet Take 1 tablet (4 mg total) by mouth every 8 (eight) hours as needed for nausea or vomiting. 12/29/20  Yes Linzie Collin, MD  Prenatal Vit-Fe Fumarate-FA (PRENATAL VITAMIN PO) Take 1 tablet by mouth daily.   Yes [provider]    Allergies Other, Mango flavor, and Yodora deodorant  Family History  Problem Relation Age of Onset  . Hypertension Mother   . Asthma Mother   . Thyroid disease Mother   . Hyperlipidemia Mother   . Other Father        "gum disease"    Social History Social History   Tobacco Use  . Smoking status: Never Smoker  . Smokeless tobacco: Never Used  Vaping Use  . Vaping Use: Never used  Substance Use Topics  . Alcohol use: No  . Drug use: No    Review of Systems  Constitutional: No fever/chills.  Positive for lightheadedness. Eyes: No visual changes. ENT: No sore throat. Cardiovascular: Denies chest pain. Respiratory: Denies shortness of breath. Gastrointestinal: No abdominal pain.  No nausea, no vomiting.  No diarrhea.  No constipation. Genitourinary: Negative for dysuria. Musculoskeletal: Negative for back pain. Skin: Negative for rash. Neurological: Positive for headaches, negative for focal weakness or numbness.  ____________________________________________   PHYSICAL EXAM:  VITAL SIGNS: ED Triage Vitals  Enc Vitals Group     BP 01/17/21 0705 134/79  Pulse Rate 01/17/21 0705 94     Resp 01/17/21 0705 16     Temp 01/17/21 0705 98.3 F (36.8 C)     Temp Source 01/17/21 0705 Oral     SpO2 01/17/21 0705 100 %     Weight 01/17/21 0706 131 lb 12.8 oz (59.8 kg)     Height 01/17/21 0706 5\' 5"  (1.651 m)     Head Circumference --      Peak Flow --      Pain Score 01/17/21 0705 10     Pain Loc --      Pain Edu? --      Excl. in GC? --     Constitutional: Alert  and oriented. Eyes: Conjunctivae are normal.  Pupils equal round reactive to light bilaterally. Head: Atraumatic. Nose: No congestion/rhinnorhea. Mouth/Throat: Mucous membranes are moist. Neck: Normal ROM Cardiovascular: Normal rate, regular rhythm. Grossly normal heart sounds. Respiratory: Normal respiratory effort.  No retractions. Lungs CTAB. Gastrointestinal: Gravid abdomen, soft and nontender. No distention. Genitourinary: deferred Musculoskeletal: No lower extremity tenderness nor edema. Neurologic:  Normal speech and language. No gross focal neurologic deficits are appreciated. Skin:  Skin is warm, dry and intact. No rash noted. Psychiatric: Mood and affect are normal. Speech and behavior are normal.  ____________________________________________   LABS (all labs ordered are listed, but only abnormal results are displayed)  Labs Reviewed  URINALYSIS, COMPLETE (UACMP) WITH MICROSCOPIC - Abnormal; Notable for the following components:      Result Value   Color, Urine YELLOW (*)    APPearance HAZY (*)    Leukocytes,Ua MODERATE (*)    Bacteria, UA RARE (*)    All other components within normal limits  BASIC METABOLIC PANEL - Abnormal; Notable for the following components:   Sodium 134 (*)    Potassium 3.3 (*)    All other components within normal limits  CBC WITH DIFFERENTIAL/PLATELET   ____________________________________________  EKG  ED ECG REPORT I, 01/19/21, the attending physician, personally viewed and interpreted this ECG.   Date: 01/17/2021  EKG Time: 8:15  Rate: 92  Rhythm: normal sinus rhythm  Axis: Normal  Intervals:none  ST&T Change: None   PROCEDURES  Procedure(s) performed (including Critical Care):  Procedures   ____________________________________________   INITIAL IMPRESSION / ASSESSMENT AND PLAN / ED COURSE       25 year old female, G1 P0 at approximately 24 weeks of pregnancy with no significant past medical history presents  to the ED complaining of intermittent headaches for the past couple of days with concern for elevated blood pressure today.  She is normotensive here in the ED and UA shows no protein, doubt preeclampsia at this time.  Given her lightheadedness and headache, we will screen EKG, CBC, BMP.  Symptoms sound consistent with possible migraine headache, we will hydrate with IV fluids and treat with IV Phenergan.  She denies any urinary symptoms and UA does not appear consistent with infection.  Fetal heart tones are appropriate.  Patient reports feeling much better following IV fluids and dose of Phenergan.  Labs are remarkable only for mild hypokalemia, which was repleted.  Patient is appropriate for discharge home with OB/GYN follow-up, was counseled to return to the ED for new worsening symptoms, patient agrees with plan.      ____________________________________________   FINAL CLINICAL IMPRESSION(S) / ED DIAGNOSES  Final diagnoses:  Acute nonintractable headache, unspecified headache type  Lightheadedness  [redacted] weeks gestation of pregnancy     ED Discharge Orders  None       Note:  This document was prepared using Dragon voice recognition software and may include unintentional dictation errors.   Chesley Noon, MD 01/17/21 [id number]

## 2021-01-20 ENCOUNTER — Other Ambulatory Visit: Payer: Self-pay

## 2021-01-20 ENCOUNTER — Other Ambulatory Visit (HOSPITAL_COMMUNITY)
Admission: RE | Admit: 2021-01-20 | Discharge: 2021-01-20 | Disposition: A | Discharge status: Home / Self Care | Payer: 59 | Source: Ambulatory Visit | Attending: Obstetrics and Gynecology | Admitting: Obstetrics and Gynecology

## 2021-01-20 ENCOUNTER — Ambulatory Visit (INDEPENDENT_AMBULATORY_CARE_PROVIDER_SITE_OTHER): Payer: 59 | Admitting: Obstetrics and Gynecology

## 2021-01-20 ENCOUNTER — Encounter: Payer: Self-pay | Admitting: Obstetrics and Gynecology

## 2021-01-20 VITALS — BP 131/89 | HR 103 | Wt 161.0 lb

## 2021-01-20 DIAGNOSIS — O26892 Other specified pregnancy related conditions, second trimester: Secondary | ICD-10-CM | POA: Diagnosis present

## 2021-01-20 DIAGNOSIS — Z3A23 23 weeks gestation of pregnancy: Secondary | ICD-10-CM | POA: Insufficient documentation

## 2021-01-20 DIAGNOSIS — N898 Other specified noninflammatory disorders of vagina: Secondary | ICD-10-CM

## 2021-01-20 DIAGNOSIS — O23592 Infection of other part of genital tract in pregnancy, second trimester: Secondary | ICD-10-CM | POA: Insufficient documentation

## 2021-01-20 DIAGNOSIS — B373 Candidiasis of vulva and vagina: Secondary | ICD-10-CM | POA: Diagnosis not present

## 2021-01-20 LAB — POCT URINALYSIS DIPSTICK OB
Bilirubin, UA: NEGATIVE
Blood, UA: NEGATIVE
Glucose, UA: NEGATIVE
Ketones, UA: NEGATIVE
Leukocytes, UA: NEGATIVE
Nitrite, UA: NEGATIVE
POC,PROTEIN,UA: NEGATIVE
Spec Grav, UA: 1.025 (ref 1.010–1.025)
Urobilinogen, UA: 0.2 E.U./dL
pH, UA: 6 (ref 5.0–8.0)

## 2021-01-20 NOTE — Progress Notes (Signed)
OB- Pt [redacted]w[redacted]d stated that when she woke up this morning that her underwear and clothes were wet like she had urinated on herself and didn't. Pt c/o lower back pain and stated that the she can feel that the baby is really low in the abd area.

## 2021-01-20 NOTE — Patient Instructions (Addendum)
Vaginitis  Vaginitis is irritation and swelling of the vagina. Treatment will depend on the cause. What are the causes? It can be caused by:  Bacteria.  Yeast.  A parasite.  A virus.  Low hormone levels.  Bubble baths, scented tampons, and feminine sprays. Other things can change the balance of the yeast and bacteria that live in the vagina. These include:  Antibiotic medicines.  Not being clean enough.  Some birth control methods.  Sex.  Infection.  Diabetes.  A weakened body defense system (immune system). What increases the risk?  Smoking or being around someone who smokes.  Using washes (douches), scented tampons, or scented pads.  Wearing tight pants or thong underwear.  Using birth control pills or an IUD.  Having sex without a condom or having a lot of partners.  Having an STI.  Using a certain product to kill sperm (nonoxynol-9).  Eating foods that are high in sugar.  Having diabetes.  Having low levels of a female hormone.  Having a weakened body defense system.  Being pregnant or breastfeeding. What are the signs or symptoms?  Fluid coming from the vagina that is not normal.  A bad smell.  Itching, pain, or swelling.  Pain with sex.  Pain or burning when you pee (urinate). Sometimes there are no symptoms. How is this treated? Treatment may include:  Antibiotic creams or pills.  Antifungal medicines.  Medicines to ease symptoms if you have a virus. Your sex partner should also be treated.  Estrogen medicines.  Avoiding scented soaps, sprays, or douches.  Stopping use of products that caused irritation and then using a cream to treat symptoms. Follow these instructions at home: Lifestyle  Keep the area around your vagina clean and dry. ? Avoid using soap. ? Rinse the area with water.  Until your doctor says it is okay: ? Do not use washes for the vagina. ? Do not use tampons. ? Do not have sex.  Wipe from front to  back after going to the bathroom.  When your doctor says it is okay, practice safe sex and use condoms. General instructions  Take over-the-counter and prescription medicines only as told by your doctor.  If you were prescribed an antibiotic medicine, take or use it as told by your doctor. Do not stop taking or using it even if you start to feel better.  Keep all follow-up visits. How is this prevented?  Do not use things that can irritate the vagina, such as fabric softeners. Avoid these products if they are scented: ? Sprays. ? Detergents. ? Tampons. ? Products for cleaning the vagina. ? Soaps or bubble baths.  Let air reach your vagina. To do this: ? Wear cotton underwear. ? Do not wear:  Underwear while you sleep.  Tight pants.  Thong underwear.  Underwear or nylons without a cotton panel. ? Take off any wet clothing, such as bathing suits, as soon as you can. ? Practice safe sex and use condoms. Contact a doctor if:  You have pain in your belly or in the area between your hips.  You have a fever or chills.  Your symptoms last for more than 2-3 days. Get help right away if:  You have a fever and your symptoms get worse all of a sudden. Summary  Vaginitis is irritation and swelling of the vagina.  Treatment will depend on the cause of the condition.  Do not use washes or tampons or have sex until your doctor says it   is okay. This information is not intended to replace advice given to you by your health care provider. Make sure you discuss any questions you have with your health care provider. Document Revised: 04/08/2020 Document Reviewed: 04/08/2020 Elsevier Patient Education  2021 Elsevier Inc.  

## 2021-01-20 NOTE — Progress Notes (Signed)
Problem OB Visit: Patient complains of large "gush of fluid this morning around 5:00 a.m., after going to the restroom earlier that morning.  Concerned for ruptured membranes.  Vaginal exam with moderate amount of white thin discharge noted, no visible fluid on Valsalva. Nitrazine and ferning slide negative. Given reassurance. Nuswab performed for discharge. RTC for regularly scheduled OB visit.

## 2021-01-21 LAB — CERVICOVAGINAL ANCILLARY ONLY
Bacterial Vaginitis (gardnerella): NEGATIVE
Candida Glabrata: NEGATIVE
Candida Vaginitis: POSITIVE — AB
Chlamydia: NEGATIVE
Comment: NEGATIVE
Comment: NEGATIVE
Comment: NEGATIVE
Comment: NEGATIVE
Comment: NEGATIVE
Comment: NORMAL
Neisseria Gonorrhea: NEGATIVE
Trichomonas: NEGATIVE

## 2021-01-21 MED ORDER — FLUCONAZOLE 150 MG PO TABS: 150.0000 mg | ORAL_TABLET | Freq: Once | ORAL | 1 refills | Status: AC

## 2021-01-21 NOTE — Addendum Note (Signed)
Addended by: Fabian November on: 01/21/2021 10:55 PM   Modules accepted: Orders

## 2021-01-25 ENCOUNTER — Encounter: Payer: 59 | Admitting: Obstetrics and Gynecology

## 2021-01-26 ENCOUNTER — Encounter: Payer: Self-pay | Admitting: Obstetrics and Gynecology

## 2021-02-01 ENCOUNTER — Ambulatory Visit (INDEPENDENT_AMBULATORY_CARE_PROVIDER_SITE_OTHER): Payer: 59 | Admitting: Obstetrics and Gynecology

## 2021-02-01 ENCOUNTER — Encounter: Payer: Self-pay | Admitting: Obstetrics and Gynecology

## 2021-02-01 ENCOUNTER — Other Ambulatory Visit: Payer: Self-pay

## 2021-02-01 VITALS — BP 118/79 | HR 85 | Wt 161.9 lb

## 2021-02-01 DIAGNOSIS — Z3A25 25 weeks gestation of pregnancy: Secondary | ICD-10-CM

## 2021-02-01 LAB — POCT URINALYSIS DIPSTICK OB
Bilirubin, UA: NEGATIVE
Blood, UA: NEGATIVE
Glucose, UA: NEGATIVE
Ketones, UA: NEGATIVE
Leukocytes, UA: NEGATIVE
Nitrite, UA: NEGATIVE
Spec Grav, UA: <=1.005 — AB (ref 1.010–1.025)
Urobilinogen, UA: 0.2 E.U./dL
pH, UA: 7 (ref 5.0–8.0)

## 2021-02-01 NOTE — Progress Notes (Signed)
ROB: No complaints.  No further leakage of fluid.  Reports daily fetal movement.  1 hour GCT next visit.

## 2021-02-24 ENCOUNTER — Encounter: Payer: Self-pay | Admitting: Obstetrics and Gynecology

## 2021-02-24 ENCOUNTER — Other Ambulatory Visit: Payer: 59

## 2021-02-24 ENCOUNTER — Ambulatory Visit (INDEPENDENT_AMBULATORY_CARE_PROVIDER_SITE_OTHER): Payer: 59 | Admitting: Obstetrics and Gynecology

## 2021-02-24 ENCOUNTER — Other Ambulatory Visit: Payer: Self-pay

## 2021-02-24 VITALS — BP 120/83 | HR 92 | Ht 65.0 in | Wt 165.4 lb

## 2021-02-24 DIAGNOSIS — Z23 Encounter for immunization: Secondary | ICD-10-CM

## 2021-02-24 DIAGNOSIS — Z3A28 28 weeks gestation of pregnancy: Secondary | ICD-10-CM

## 2021-02-24 DIAGNOSIS — Z3403 Encounter for supervision of normal first pregnancy, third trimester: Secondary | ICD-10-CM

## 2021-02-24 LAB — POCT URINALYSIS DIPSTICK OB
Bilirubin, UA: NEGATIVE
Blood, UA: NEGATIVE
Glucose, UA: NEGATIVE
Ketones, UA: NEGATIVE
Leukocytes, UA: NEGATIVE
Nitrite, UA: NEGATIVE
POC,PROTEIN,UA: NEGATIVE
Spec Grav, UA: 1.015 (ref 1.010–1.025)
Urobilinogen, UA: 0.2 E.U./dL
pH, UA: 8.5 — AB (ref 5.0–8.0)

## 2021-02-24 MED ORDER — TETANUS-DIPHTH-ACELL PERTUSSIS 5-2.5-18.5 LF-MCG/0.5 IM SUSY
0.5000 mL | PREFILLED_SYRINGE | Freq: Once | INTRAMUSCULAR | Status: AC
  Administered 2021-02-24: 0.5 mL via INTRAMUSCULAR

## 2021-02-24 NOTE — Progress Notes (Signed)
OB-Pt present for routine prenatal care, 28 week labs, BTC and TDap. Pt stated that she was doing well.

## 2021-02-24 NOTE — Progress Notes (Signed)
ROB: Doing well, no complaints. For 28 week labs today.  Desires to to breastfeed, considering contraceptive patch for contraception but unsure. Given handout on contraception. For Tdap today, signed blood consent. Discussed pain management in labor, desires epidural. Also given info on birth plan.    The following were addressed during this visit:  Breastfeeding Education - Nonpharmacological pain relief methods for labor    Comments: Deep breathing, focusing on pleasant things, movement and walking, heating pads or cold compress, massage and relaxation, continuous support from someone you trust, and Doulas   - Individualized Education    Comments: Contraindications to breastfeeding and other special medical conditions

## 2021-02-24 NOTE — Patient Instructions (Signed)
WHAT OB PATIENTS CAN EXPECT   Confirmation of pregnancy and ultrasound ordered if medically indicated-[redacted] weeks gestation  New OB (NOB) intake with nurse and New OB (NOB) labs- [redacted] weeks gestation  New OB (NOB) physical examination with provider- 11/[redacted] weeks gestation  Flu vaccine-[redacted] weeks gestation  Anatomy scan-[redacted] weeks gestation  Glucose tolerance test, blood work to test for anemia, T-dap vaccine-[redacted] weeks gestation  Vaginal swabs/cultures-STD/Group B strep-[redacted] weeks gestation  Appointments every 4 weeks until 28 weeks  Every 2 weeks from 28 weeks until 36 weeks  Weekly visits from 82 weeks until delivery  Third Trimester of Pregnancy  The third trimester of pregnancy is from week 28 through week 55. This is also called months 7 through 9. This trimester is when your unborn baby (fetus) is growing very fast. At the end of the ninth month, the unborn baby is about 20 inches long. It weighs about 6-10 pounds. Body changes during your third trimester Your body continues to go through many changes during this time. The changes vary and generally return to normal after the baby is born. Physical changes  Your weight will continue to increase. You may gain 25-35 pounds (11-16 kg) by the end of the pregnancy. If you are underweight, you may gain 28-40 lb (about 13-18 kg). If you are overweight, you may gain 15-25 lb (about 7-11 kg).  You may start to get stretch marks on your hips, belly (abdomen), and breasts.  Your breasts will continue to grow and may hurt. A yellow fluid (colostrum) may leak from your breasts. This is the first milk you are making for your baby.  You may have changes in your hair.  Your belly button may stick out.  You may have more swelling in your hands, face, or ankles. Health changes  You may have heartburn.  You may have trouble pooping (constipation).  You may get hemorrhoids. These are swollen veins in the butt that can itch or get painful.  You may  have swollen veins (varicose veins) in your legs.  You may have more body aches in the pelvis, back, or thighs.  You may have more tingling or numbness in your hands, arms, and legs. The skin on your belly may also feel numb.  You may feel short of breath as your womb (uterus) gets bigger. Other changes  You may pee (urinate) more often.  You may have more problems sleeping.  You may notice the unborn baby "dropping," or moving lower in your belly.  You may have more discharge coming from your vagina.  Your joints may feel loose, and you may have pain around your pelvic bone. Follow these instructions at home: Medicines  Take over-the-counter and prescription medicines only as told by your doctor. Some medicines are not safe during pregnancy.  Take a prenatal vitamin that contains at least 600 micrograms (mcg) of folic acid. Eating and drinking  Eat healthy meals that include: ? Fresh fruits and vegetables. ? Whole grains. ? Good sources of protein, such as meat, eggs, or tofu. ? Low-fat dairy products.  Avoid raw meat and unpasteurized juice, milk, and cheese. These carry germs that can harm you and your baby.  Eat 4 or 5 small meals rather than 3 large meals a day.  You may need to take these actions to prevent or treat trouble pooping: ? Drink enough fluids to keep your pee (urine) pale yellow. ? Eat foods that are high in fiber. These include beans, whole grains, and  fresh fruits and vegetables. ? Limit foods that are high in fat and sugar. These include fried or sweet foods. Activity  Exercise only as told by your doctor. Stop exercising if you start to have cramps in your womb.  Avoid heavy lifting.  Do not exercise if it is too hot or too humid, or if you are in a place of great height (high altitude).  If you choose to, you may have sex unless your doctor tells you not to. Relieving pain and discomfort  Take breaks often, and rest with your legs raised  (elevated) if you have leg cramps or low back pain.  Take warm water baths (sitz baths) to soothe pain or discomfort caused by hemorrhoids. Use hemorrhoid cream if your doctor approves.  Wear a good support bra if your breasts are tender.  If you develop bulging, swollen veins in your legs: ? Wear support hose as told by your doctor. ? Raise your feet for 15 minutes, 3-4 times a day. ? Limit salt in your food. Safety  Talk to your doctor before traveling far distances.  Do not use hot tubs, steam rooms, or saunas.  Wear your seat belt at all times when you are in a car.  Talk with your doctor if someone is hurting you or yelling at you a lot. Preparing for your baby's arrival To prepare for the arrival of your baby:  Take prenatal classes.  Visit the hospital and tour the maternity area.  Buy a rear-facing car seat. Learn how to install it in your car.  Prepare the baby's room. Take out all pillows and stuffed animals from the baby's crib. General instructions  Avoid cat litter boxes and soil used by cats. These carry germs that can cause harm to the baby and can cause a loss of your baby by miscarriage or stillbirth.  Do not douche or use tampons. Do not use scented sanitary pads.  Do not smoke or use any products that contain nicotine or tobacco. If you need help quitting, ask your doctor.  Do not drink alcohol.  Do not use herbal medicines, illegal drugs, or medicines that were not approved by your doctor. Chemicals in these products can affect your baby.  Keep all follow-up visits. This is important. Where to find more information  American Pregnancy Association: americanpregnancy.org  [employer]: www.acog.org  Office on Women's Health: KeywordPortfolios.com.br Contact a doctor if:  You have a fever.  You have mild cramps or pressure in your lower belly.  You have a nagging pain in your belly area.  You vomit, or  you have watery poop (diarrhea).  You have bad-smelling fluid coming from your vagina.  You have pain when you pee, or your pee smells bad.  You have a headache that does not go away when you take medicine.  You have changes in how you see, or you see spots in front of your eyes. Get help right away if:  Your water breaks.  You have regular contractions that are less than 5 minutes apart.  You are spotting or bleeding from your vagina.  You have very bad belly cramps or pain.  You have trouble breathing.  You have chest pain.  You faint.  You have not felt the baby move for the amount of time told by your doctor.  You have new or increased pain, swelling, or redness in an arm or leg. Summary  The third trimester is from week 28 through  week 40 (months 7 through 9). This is the time when your unborn baby is growing very fast.  During this time, your discomfort may increase as you gain weight and as your baby grows.  Get ready for your baby to arrive by taking prenatal classes, buying a rear-facing car seat, and preparing the baby's room.  Get help right away if you are bleeding from your vagina, you have chest pain and trouble breathing, or you have not felt the baby move for the amount of time told by your doctor. This information is not intended to replace advice given to you by your health care provider. Make sure you discuss any questions you have with your health care provider. Document Revised: 03/17/2020 Document Reviewed: 01/22/2020 Elsevier Patient Education  [place]. Common Medications Safe in Pregnancy  Acne:      Constipation:  Benzoyl Peroxide     Colace  Clindamycin      Dulcolax Suppository  Topica Erythromycin     Fibercon  Salicylic Acid      Metamucil         Miralax AVOID:        Senakot   Accutane    Cough:  Retin-A       Cough Drops  Tetracycline      Phenergan w/ Codeine if Rx  Minocycline      Robitussin (Plain &  DM)  Antibiotics:     Crabs/Lice:  Ceclor       RID  Cephalosporins    AVOID:  E-Mycins      Kwell  Keflex  Macrobid/Macrodantin   Diarrhea:  Penicillin      Kao-Pectate  Zithromax      Imodium AD         PUSH FLUIDS AVOID:       Cipro     Fever:  Tetracycline      Tylenol (Regular or Extra  Minocycline       Strength)  Levaquin      Extra Strength-Do not          Exceed 8 tabs/24 hrs Caffeine:        <276m/day (equiv. To 1 cup of coffee or  approx. 3 12 oz sodas)         Gas: Cold/Hayfever:       Gas-X  Benadryl      Mylicon  Claritin       Phazyme  **Claritin-D        Chlor-Trimeton    Headaches:  Dimetapp      ASA-Free Excedrin  Drixoral-Non-Drowsy     Cold Compress  Mucinex (Guaifenasin)     Tylenol (Regular or Extra  Sudafed/Sudafed-12 Hour     Strength)  **Sudafed PE Pseudoephedrine   Tylenol Cold & Sinus     Vicks Vapor Rub  Zyrtec  **AVOID if Problems With Blood Pressure         Heartburn: Avoid lying down for at least 1 hour after meals  Aciphex      Maalox     Rash:  Milk of Magnesia     Benadryl    Mylanta       1% Hydrocortisone Cream  Pepcid  Pepcid Complete   Sleep Aids:  Prevacid      Ambien   Prilosec       Benadryl  Rolaids       Chamomile Tea  Tums (Limit 4/day)     Unisom  Tylenol PM         Warm milk-add vanilla or  Hemorrhoids:       Sugar for taste  Anusol/Anusol H.C.  (RX: Analapram 2.5%)  Sugar Substitutes:  Hydrocortisone OTC     Ok in moderation  Preparation H      Tucks        Vaseline lotion applied to tissue with wiping    Herpes:     Throat:  Acyclovir      Oragel  Famvir  Valtrex     Vaccines:         Flu Shot Leg Cramps:       *Gardasil  Benadryl      Hepatitis A         Hepatitis B Nasal Spray:       Pneumovax  Saline Nasal Spray     Polio Booster         Tetanus Nausea:       Tuberculosis test or PPD  Vitamin B6 25 mg TID   AVOID:    Dramamine      *Gardasil  Emetrol       Live Poliovirus  Ginger  Root 250 mg QID    MMR (measles, mumps &  High Complex Carbs @ Bedtime    rebella)  Sea Bands-Accupressure    Varicella (Chickenpox)  Unisom 1/2 tab TID     *No known complications           If received before Pain:         Known pregnancy;   Darvocet       Resume series after  Lortab        Delivery  Percocet    Yeast:   Tramadol      Femstat  Tylenol 3      Gyne-lotrimin  Ultram       Monistat  Vicodin           MISC:         All Sunscreens           Hair Coloring/highlights          Insect Repellant's          (Including DEET)         Mystic Tans Tdap (Tetanus, Diphtheria, Pertussis) Vaccine: What You Need to Know 1. Why get vaccinated? Tdap vaccine can prevent tetanus, diphtheria, and pertussis. Diphtheria and pertussis spread from person to person. Tetanus enters the body through cuts or wounds.  TETANUS (T) causes painful stiffening of the muscles. Tetanus can lead to serious health problems, including being unable to open the mouth, having trouble swallowing and breathing, or death.  DIPHTHERIA (D) can lead to difficulty breathing, heart failure, paralysis, or death.  PERTUSSIS (aP), also known as "whooping cough," can cause uncontrollable, violent coughing that makes it hard to breathe, eat, or drink. Pertussis can be extremely serious especially in babies and young children, causing pneumonia, convulsions, brain damage, or death. In teens and adults, it can cause weight loss, loss of bladder control, passing out, and rib fractures from severe coughing. 2. Tdap vaccine Tdap is only for children 7 years and older, adolescents, and adults.  Adolescents should receive a single dose of Tdap, preferably at age 84 or 53 years. Pregnant people should get a dose of Tdap during every pregnancy, preferably during the early part of the third trimester, to help protect the newborn from pertussis. Infants are most at risk for severe, life-threatening complications from  pertussis. Adults  who have never received Tdap should get a dose of Tdap. Also, adults should receive a booster dose of either Tdap or Td (a different vaccine that protects against tetanus and diphtheria but not pertussis) every 10 years, or after 5 years in the case of a severe or dirty wound or burn. Tdap may be given at the same time as other vaccines. 3. Talk with your health care provider Tell your vaccine provider if the person getting the vaccine:  Has had an allergic reaction after a previous dose of any vaccine that protects against tetanus, diphtheria, or pertussis, or has any severe, life-threatening allergies  Has had a coma, decreased level of consciousness, or prolonged seizures within 7 days after a previous dose of any pertussis vaccine (DTP, DTaP, or Tdap)  Has seizures or another nervous system problem  Has ever had Guillain-Barr Syndrome (also called "GBS")  Has had severe pain or swelling after a previous dose of any vaccine that protects against tetanus or diphtheria In some cases, your health care provider may decide to postpone Tdap vaccination until a future visit. People with minor illnesses, such as a cold, may be vaccinated. People who are moderately or severely ill should usually wait until they recover before getting Tdap vaccine.  Your health care provider can give you more information. 4. Risks of a vaccine reaction  Pain, redness, or swelling where the shot was given, mild fever, headache, feeling tired, and nausea, vomiting, diarrhea, or stomachache sometimes happen after Tdap vaccination. People sometimes faint after medical procedures, including vaccination. Tell your provider if you feel dizzy or have vision changes or ringing in the ears.  As with any medicine, there is a very remote chance of a vaccine causing a severe allergic reaction, other serious injury, or death. 5. What if there is a serious problem? An allergic reaction could occur after the vaccinated person  leaves the clinic. If you see signs of a severe allergic reaction (hives, swelling of the face and throat, difficulty breathing, a fast heartbeat, dizziness, or weakness), call 9-1-1 and get the person to the nearest hospital. For other signs that concern you, call your health care provider.  Adverse reactions should be reported to the Vaccine Adverse Event Reporting System (VAERS). Your health care provider will usually file this report, or you can do it yourself. Visit the VAERS website at www.vaers.SamedayNews.es or call (316) 473-3871. VAERS is only for reporting reactions, and VAERS staff members do not give medical advice. 6. The National Vaccine Injury Compensation Program The [employer] Vaccine Injury Compensation Program (VICP) is a federal program that was created to compensate people who may have been injured by certain vaccines. Claims regarding alleged injury or death due to vaccination have a time limit for filing, which may be as short as two years. Visit the VICP website at GoldCloset.com.ee or call (502)175-8733 to learn about the program and about filing a claim. 7. How can I learn more?  Ask your health care provider.  Call your local or state health department.  Visit the website of the Food and Drug Administration (FDA) for vaccine package inserts and additional information at TraderRating.uy.  Contact the Centers for Disease Control and Prevention (CDC): ? Call 631-320-4042 (1-800-CDC-INFO) or ? Visit CDC's website at http://hunter.com/. Vaccine Information Statement Tdap (Tetanus, Diphtheria, Pertussis) Vaccine (05/28/2020) This information is not intended to replace advice given to you by your health care provider. Make sure you discuss any questions you have with your health care  provider. Document Revised: 06/23/2020 Document Reviewed: 06/23/2020 Elsevier Patient Education  2021 [employer].

## 2021-02-25 LAB — CBC WITH DIFF/PLATELET
Basophils Absolute: 0 10*3/uL (ref 0.0–0.2)
Basos: 0 %
EOS (ABSOLUTE): 0.1 10*3/uL (ref 0.0–0.4)
Eos: 1 %
Hematocrit: 37.5 % (ref 34.0–46.6)
Hemoglobin: 13 g/dL (ref 11.1–15.9)
Immature Grans (Abs): 0.1 10*3/uL (ref 0.0–0.1)
Immature Granulocytes: 1 %
Lymphocytes Absolute: 1.4 10*3/uL (ref 0.7–3.1)
Lymphs: 14 %
MCH: 30.2 pg (ref 26.6–33.0)
MCHC: 34.7 g/dL (ref 31.5–35.7)
MCV: 87 fL (ref 79–97)
Monocytes Absolute: 0.9 10*3/uL (ref 0.1–0.9)
Monocytes: 8 %
Neutrophils Absolute: 7.7 10*3/uL — ABNORMAL HIGH (ref 1.4–7.0)
Neutrophils: 76 %
Platelets: 266 10*3/uL (ref 150–450)
RBC: 4.3 x10E6/uL (ref 3.77–5.28)
RDW: 12.9 % (ref 11.7–15.4)
WBC: 10.2 10*3/uL (ref 3.4–10.8)

## 2021-02-25 LAB — GLUCOSE TOLERANCE, 1 HOUR: Glucose, 1Hr PP: 86 mg/dL (ref 65–199)

## 2021-02-25 LAB — RPR: RPR Ser Ql: NONREACTIVE

## 2021-03-10 ENCOUNTER — Other Ambulatory Visit: Payer: Self-pay

## 2021-03-10 ENCOUNTER — Encounter: Payer: Self-pay | Admitting: Obstetrics and Gynecology

## 2021-03-10 ENCOUNTER — Ambulatory Visit (INDEPENDENT_AMBULATORY_CARE_PROVIDER_SITE_OTHER): Payer: 59 | Admitting: Obstetrics and Gynecology

## 2021-03-10 VITALS — BP 118/80 | HR 87 | Wt 166.4 lb

## 2021-03-10 DIAGNOSIS — Z3403 Encounter for supervision of normal first pregnancy, third trimester: Secondary | ICD-10-CM

## 2021-03-10 DIAGNOSIS — Z3A30 30 weeks gestation of pregnancy: Secondary | ICD-10-CM

## 2021-03-10 LAB — POCT URINALYSIS DIPSTICK OB
Bilirubin, UA: NEGATIVE
Blood, UA: NEGATIVE
Glucose, UA: NEGATIVE
Ketones, UA: NEGATIVE
Leukocytes, UA: NEGATIVE
Nitrite, UA: NEGATIVE
POC,PROTEIN,UA: NEGATIVE
Spec Grav, UA: 1.01 (ref 1.010–1.025)
Urobilinogen, UA: 0.2 E.U./dL
pH, UA: 8 (ref 5.0–8.0)

## 2021-03-10 NOTE — Progress Notes (Signed)
ROB: As noted occasional flashing red and blue lights 1-2 times per week lasting 30 seconds.  This does not limit her activities-is not disabling.  Resolves without treatment.  Patient to continue to monitor.  If it becomes more frequent or longer lasting consider ophthalmology referral.  Otherwise well reports daily fetal movement.

## 2021-03-14 ENCOUNTER — Other Ambulatory Visit: Payer: Self-pay

## 2021-03-14 ENCOUNTER — Observation Stay
Admission: EM | Admit: 2021-03-14 | Discharge: 2021-03-14 | Disposition: A | Discharge status: Home / Self Care | Payer: 59 | Attending: Obstetrics and Gynecology | Admitting: Obstetrics and Gynecology

## 2021-03-14 ENCOUNTER — Encounter: Payer: Self-pay | Admitting: Obstetrics and Gynecology

## 2021-03-14 DIAGNOSIS — Z3402 Encounter for supervision of normal first pregnancy, second trimester: Secondary | ICD-10-CM

## 2021-03-14 DIAGNOSIS — R102 Pelvic and perineal pain: Secondary | ICD-10-CM | POA: Diagnosis present

## 2021-03-14 DIAGNOSIS — O4703 False labor before 37 completed weeks of gestation, third trimester: Secondary | ICD-10-CM | POA: Diagnosis present

## 2021-03-14 DIAGNOSIS — Z3490 Encounter for supervision of normal pregnancy, unspecified, unspecified trimester: Secondary | ICD-10-CM

## 2021-03-14 DIAGNOSIS — Z3A31 31 weeks gestation of pregnancy: Secondary | ICD-10-CM | POA: Diagnosis not present

## 2021-03-14 DIAGNOSIS — U071 COVID-19: Secondary | ICD-10-CM

## 2021-03-14 DIAGNOSIS — O98519 Other viral diseases complicating pregnancy, unspecified trimester: Secondary | ICD-10-CM

## 2021-03-14 DIAGNOSIS — O26893 Other specified pregnancy related conditions, third trimester: Secondary | ICD-10-CM | POA: Diagnosis present

## 2021-03-14 HISTORY — DX: Anxiety disorder, unspecified: F41.9

## 2021-03-14 LAB — URINALYSIS, ROUTINE W REFLEX MICROSCOPIC
Bilirubin Urine: NEGATIVE
Glucose, UA: NEGATIVE mg/dL
Hgb urine dipstick: NEGATIVE
Ketones, ur: NEGATIVE mg/dL
Nitrite: NEGATIVE
Protein, ur: NEGATIVE mg/dL
Specific Gravity, Urine: 1.019 (ref 1.005–1.030)
pH: 8 (ref 5.0–8.0)

## 2021-03-14 LAB — WET PREP, GENITAL
Clue Cells Wet Prep HPF POC: NONE SEEN
Sperm: NONE SEEN
Trich, Wet Prep: NONE SEEN
Yeast Wet Prep HPF POC: NONE SEEN

## 2021-03-14 MED ORDER — ACETAMINOPHEN 325 MG PO TABS
650.0000 mg | ORAL_TABLET | Freq: Four times a day (QID) | ORAL | Status: DC | PRN
  Administered 2021-03-14: 650 mg via ORAL

## 2021-03-14 MED ORDER — ACETAMINOPHEN 325 MG PO TABS
ORAL_TABLET | ORAL | Status: AC
  Filled 2021-03-14: qty 2

## 2021-03-14 NOTE — OB Triage Note (Addendum)
Pt G2P0 [redacted]w[redacted]d presents to birthplace via EMS through ED, w/ c/o ctx that have been ongoing for several months and got more intense in the last day and started feeling vaginal pressure. Pt reports being nervous so she called EMS. Pt reports no LOF, no vaginal bleeding and positive FM. VSS. Monitors applied and assessing, fetal heart tones in 150's.

## 2021-03-14 NOTE — OB Triage Note (Signed)
Pt discharged home in good condition w/ mother. RN at bedside for discharge instructions including education on difference between labor and braxton hicks ctx and normal aches and pains in pregnancy. Pt instructed to come in with consistent ctx, vag bleeding, LOF, or decreased fetal movement. Pt verbalized understanding.

## 2021-03-16 DIAGNOSIS — R102 Pelvic and perineal pain: Secondary | ICD-10-CM | POA: Diagnosis present

## 2021-03-16 NOTE — Final Progress Note (Signed)
L&D OB Triage Note  Nancy Brown is a 25 y.o. G2P0010 female at [redacted]w[redacted]d, EDD Estimated Date of Delivery: 05/15/21 who presented to triage for complaints of contractions that increased in intensity today, along with feeling vaginal pressure.  Denied leaking fluids or vaginal bleeding.  Endorses good fetal movement. She was evaluated by the nurses with no significant findings for preterm labor. Vital signs stable.  An NST was performed and has been reviewed by MD. She was treated with PO Tylenol and oral hydration.    Physical Exam:  Blood pressure 124/67, pulse 85, temperature 98.6 F (37 C), temperature source Oral, last menstrual period 06/13/2020.  Cervical exam deferred.   NST INTERPRETATION: Indications: rule out uterine contractions  Mode: External Baseline Rate (A): 140 bpm Variability: Moderate Accelerations: 15 x 15 Decelerations: None     Contraction Frequency (min): one w/ UI  Impression: reactive    Labs:  Results for orders placed or performed during the hospital encounter of 03/14/21  Wet prep, genital   Specimen: Vaginal  Result Value Ref Range   Yeast Wet Prep HPF POC NONE SEEN NONE SEEN   Trich, Wet Prep NONE SEEN NONE SEEN   Clue Cells Wet Prep HPF POC NONE SEEN NONE SEEN   WBC, Wet Prep HPF POC FEW (A) NONE SEEN   Sperm NONE SEEN   Urinalysis, Routine w reflex microscopic Urine, Clean Catch  Result Value Ref Range   Color, Urine YELLOW (A) YELLOW   APPearance HAZY (A) CLEAR   Specific Gravity, Urine 1.019 1.005 - 1.030   pH 8.0 5.0 - 8.0   Glucose, UA NEGATIVE NEGATIVE mg/dL   Hgb urine dipstick NEGATIVE NEGATIVE   Bilirubin Urine NEGATIVE NEGATIVE   Ketones, ur NEGATIVE NEGATIVE mg/dL   Protein, ur NEGATIVE NEGATIVE mg/dL   Nitrite NEGATIVE NEGATIVE   Leukocytes,Ua TRACE (A) NEGATIVE   RBC / HPF 0-5 0 - 5 RBC/hpf   WBC, UA 0-5 0 - 5 WBC/hpf   Bacteria, UA FEW (A) NONE SEEN   Squamous Epithelial / LPF 0-5 0 - 5   Mucus PRESENT      Plan: NST  performed was reviewed and was found to be reactive. She was discharged home with bleeding/labor precautions.  Continue routine prenatal care. Follow up with OB/GYN as previously scheduled.     Hildred Laser, MD [facility]

## 2021-03-23 NOTE — Patient Instructions (Addendum)
WHAT OB PATIENTS CAN EXPECT   Confirmation of pregnancy and ultrasound ordered if medically indicated-[redacted] weeks gestation  New OB (NOB) intake with nurse and New OB (NOB) labs- [redacted] weeks gestation  New OB (NOB) physical examination with provider- 11/[redacted] weeks gestation  Flu vaccine-[redacted] weeks gestation  Anatomy scan-[redacted] weeks gestation  Glucose tolerance test, blood work to test for anemia, T-dap vaccine-[redacted] weeks gestation  Vaginal swabs/cultures-STD/Group B strep-[redacted] weeks gestation  Appointments every 4 weeks until 28 weeks  Every 2 weeks from 28 weeks until 36 weeks  Weekly visits from 36 weeks until delivery  Common Medications Safe in Pregnancy  Acne:      Constipation:  Benzoyl Peroxide     Colace  Clindamycin      Dulcolax Suppository  Topica Erythromycin     Fibercon  Salicylic Acid      Metamucil         Miralax AVOID:        Senakot   Accutane    Cough:  Retin-A       Cough Drops  Tetracycline      Phenergan w/ Codeine if Rx  Minocycline      Robitussin (Plain & DM)  Antibiotics:     Crabs/Lice:  Ceclor       RID  Cephalosporins    AVOID:  E-Mycins      Kwell  Keflex  Macrobid/Macrodantin   Diarrhea:  Penicillin      Kao-Pectate  Zithromax      Imodium AD         PUSH FLUIDS AVOID:       Cipro     Fever:  Tetracycline      Tylenol (Regular or Extra  Minocycline       Strength)  Levaquin      Extra Strength-Do not          Exceed 8 tabs/24 hrs Caffeine:        <256m/day (equiv. To 1 cup of coffee or  approx. 3 12 oz sodas)         Gas: Cold/Hayfever:       Gas-X  Benadryl      Mylicon  Claritin       Phazyme  **Claritin-D        Chlor-Trimeton    Headaches:  Dimetapp      ASA-Free Excedrin  Drixoral-Non-Drowsy     Cold Compress  Mucinex (Guaifenasin)     Tylenol (Regular or Extra  Sudafed/Sudafed-12 Hour     Strength)  **Sudafed PE Pseudoephedrine   Tylenol Cold & Sinus     Vicks Vapor Rub  Zyrtec  **AVOID if Problems With Blood  Pressure         Heartburn: Avoid lying down for at least 1 hour after meals  Aciphex      Maalox     Rash:  Milk of Magnesia     Benadryl    Mylanta       1% Hydrocortisone Cream  Pepcid  Pepcid Complete   Sleep Aids:  Prevacid      Ambien   Prilosec       Benadryl  Rolaids       Chamomile Tea  Tums (Limit 4/day)     Unisom         Tylenol PM         Warm milk-add vanilla or  Hemorrhoids:       Sugar for taste  Anusol/Anusol H.C.  (RX: Analapram 2.5%)  Sugar Substitutes:  Hydrocortisone OTC     Ok in moderation  Preparation H      Tucks        Vaseline lotion applied to tissue with wiping    Herpes:     Throat:  Acyclovir      Oragel  Famvir  Valtrex     Vaccines:         Flu Shot Leg Cramps:       *Gardasil  Benadryl      Hepatitis A         Hepatitis B Nasal Spray:       Pneumovax  Saline Nasal Spray     Polio Booster         Tetanus Nausea:       Tuberculosis test or PPD  Vitamin B6 25 mg TID   AVOID:    Dramamine      *Gardasil  Emetrol       Live Poliovirus  Ginger Root 250 mg QID    MMR (measles, mumps &  High Complex Carbs @ Bedtime    rebella)  Sea Bands-Accupressure    Varicella (Chickenpox)  Unisom 1/2 tab TID     *No known complications           If received before Pain:         Known pregnancy;   Darvocet       Resume series after  Lortab        Delivery  Percocet    Yeast:   Tramadol      Femstat  Tylenol 3      Gyne-lotrimin  Ultram       Monistat  Vicodin           MISC:         All Sunscreens           Hair Coloring/highlights          Insect Repellant's          (Including DEET)         Mystic Tans Breastfeeding  Choosing to breastfeed is one of the best decisions you can make for yourself and your baby. A change in hormones during pregnancy causes your breasts to make breast milk in your milk-producing glands. Hormones prevent breast milk from being released before your baby is born. They also prompt milk flow after birth. Once  breastfeeding has begun, thoughts of your baby, as well as his or her sucking or crying, can stimulate the release of milk from your milk-producing glands. Benefits of breastfeeding Research shows that breastfeeding offers many health benefits for infants and mothers. It also offers a cost-free and convenient way to feed your baby. For your baby  Your first milk (colostrum) helps your baby's digestive system to function better.  Special cells in your milk (antibodies) help your baby to fight off infections.  Breastfed babies are less likely to develop asthma, allergies, obesity, or type 2 diabetes. They are also at lower risk for sudden infant death syndrome (SIDS).  Nutrients in breast milk are better able to meet your baby's needs compared to infant formula.  Breast milk improves your baby's brain development. For you  Breastfeeding helps to create a very special bond between you and your baby.  Breastfeeding is convenient. Breast milk costs nothing and is always available at the correct temperature.  Breastfeeding helps to burn calories. It helps you to lose the weight that you gained during pregnancy.  Breastfeeding  makes your uterus return faster to its size before pregnancy. It also slows bleeding (lochia) after you give birth.  Breastfeeding helps to lower your risk of developing type 2 diabetes, osteoporosis, rheumatoid arthritis, cardiovascular disease, and breast, ovarian, uterine, and endometrial cancer later in life. Breastfeeding basics Starting breastfeeding  Find a comfortable place to sit or lie down, with your neck and back well-supported.  Place a pillow or a rolled-up blanket under your baby to bring him or her to the level of your breast (if you are seated). Nursing pillows are specially designed to help support your arms and your baby while you breastfeed.  Make sure that your baby's tummy (abdomen) is facing your abdomen.  Gently massage your breast. With your  fingertips, massage from the outer edges of your breast inward toward the nipple. This encourages milk flow. If your milk flows slowly, you may need to continue this action during the feeding.  Support your breast with 4 fingers underneath and your thumb above your nipple (make the letter "C" with your hand). Make sure your fingers are well away from your nipple and your baby's mouth.  Stroke your baby's lips gently with your finger or nipple.  When your baby's mouth is open wide enough, quickly bring your baby to your breast, placing your entire nipple and as much of the areola as possible into your baby's mouth. The areola is the colored area around your nipple. ? More areola should be visible above your baby's upper lip than below the lower lip. ? Your baby's lips should be opened and extended outward (flanged) to ensure an adequate, comfortable latch. ? Your baby's tongue should be between his or her lower gum and your breast.  Make sure that your baby's mouth is correctly positioned around your nipple (latched). Your baby's lips should create a seal on your breast and be turned out (everted).  It is common for your baby to suck about 2-3 minutes in order to start the flow of breast milk. Latching Teaching your baby how to latch onto your breast properly is very important. An improper latch can cause nipple pain, decreased milk supply, and poor weight gain in your baby. Also, if your baby is not latched onto your nipple properly, he or she may swallow some air during feeding. This can make your baby fussy. Burping your baby when you switch breasts during the feeding can help to get rid of the air. However, teaching your baby to latch on properly is still the best way to prevent fussiness from swallowing air while breastfeeding. Signs that your baby has successfully latched onto your nipple  Silent tugging or silent sucking, without causing you pain. Infant's lips should be extended outward  (flanged).  Swallowing heard between every 3-4 sucks once your milk has started to flow (after your let-down milk reflex occurs).  Muscle movement above and in front of his or her ears while sucking. Signs that your baby has not successfully latched onto your nipple  Sucking sounds or smacking sounds from your baby while breastfeeding.  Nipple pain. If you think your baby has not latched on correctly, slip your finger into the corner of your baby's mouth to break the suction and place it between your baby's gums. Attempt to start breastfeeding again. Signs of successful breastfeeding Signs from your baby  Your baby will gradually decrease the number of sucks or will completely stop sucking.  Your baby will fall asleep.  Your baby's body will relax.  Your baby will retain a small amount of milk in his or her mouth.  Your baby will let go of your breast by himself or herself. Signs from you  Breasts that have increased in firmness, weight, and size 1-3 hours after feeding.  Breasts that are softer immediately after breastfeeding.  Increased milk volume, as well as a change in milk consistency and color by the fifth day of breastfeeding.  Nipples that are not sore, cracked, or bleeding. Signs that your baby is getting enough milk  Wetting at least 1-2 diapers during the first 24 hours after birth.  Wetting at least 5-6 diapers every 24 hours for the first week after birth. The urine should be clear or pale yellow by the age of 5 days.  Wetting 6-8 diapers every 24 hours as your baby continues to grow and develop.  At least 3 stools in a 24-hour period by the age of 5 days. The stool should be soft and yellow.  At least 3 stools in a 24-hour period by the age of 7 days. The stool should be seedy and yellow.  No loss of weight greater than 10% of birth weight during the first 3 days of life.  Average weight gain of 4-7 oz (113-198 g) per week after the age of 4  days.  Consistent daily weight gain by the age of 5 days, without weight loss after the age of 2 weeks. After a feeding, your baby may spit up a small amount of milk. This is normal. Breastfeeding frequency and duration Frequent feeding will help you make more milk and can prevent sore nipples and extremely full breasts (breast engorgement). Breastfeed when you feel the need to reduce the fullness of your breasts or when your baby shows signs of hunger. This is called "breastfeeding on demand." Signs that your baby is hungry include:  Increased alertness, activity, or restlessness.  Movement of the head from side to side.  Opening of the mouth when the corner of the mouth or cheek is stroked (rooting).  Increased sucking sounds, smacking lips, cooing, sighing, or squeaking.  Hand-to-mouth movements and sucking on fingers or hands.  Fussing or crying. Avoid introducing a pacifier to your baby in the first 4-6 weeks after your baby is born. After this time, you may choose to use a pacifier. Research has shown that pacifier use during the first year of a baby's life decreases the risk of sudden infant death syndrome (SIDS). Allow your baby to feed on each breast as long as he or she wants. When your baby unlatches or falls asleep while feeding from the first breast, offer the second breast. Because newborns are often sleepy in the first few weeks of life, you may need to awaken your baby to get him or her to feed. Breastfeeding times will vary from baby to baby. However, the following rules can serve as a guide to help you make sure that your baby is properly fed:  Newborns (babies 64 weeks of age or younger) may breastfeed every 1-3 hours.  Newborns should not go without breastfeeding for longer than 3 hours during the day or 5 hours during the night.  You should breastfeed your baby a minimum of 8 times in a 24-hour period. Breast milk pumping Pumping and storing breast milk allows you to  make sure that your baby is exclusively fed your breast milk, even at times when you are unable to breastfeed. This is especially important if you go back to  work while you are still breastfeeding, or if you are not able to be present during feedings. Your lactation consultant can help you find a method of pumping that works best for you and give you guidelines about how long it is safe to store breast milk.      Caring for your breasts while you breastfeed Nipples can become dry, cracked, and sore while breastfeeding. The following recommendations can help keep your breasts moisturized and healthy:  Avoid using soap on your nipples.  Wear a supportive bra designed especially for nursing. Avoid wearing underwire-style bras or extremely tight bras (sports bras).  Air-dry your nipples for 3-4 minutes after each feeding.  Use only cotton bra pads to absorb leaked breast milk. Leaking of breast milk between feedings is normal.  Use lanolin on your nipples after breastfeeding. Lanolin helps to maintain your skin's normal moisture barrier. Pure lanolin is not harmful (not toxic) to your baby. You may also hand express a few drops of breast milk and gently massage that milk into your nipples and allow the milk to air-dry. In the first few weeks after giving birth, some women experience breast engorgement. Engorgement can make your breasts feel heavy, warm, and tender to the touch. Engorgement peaks within 3-5 days after you give birth. The following recommendations can help to ease engorgement:  Completely empty your breasts while breastfeeding or pumping. You may want to start by applying warm, moist heat (in the shower or with warm, water-soaked hand towels) just before feeding or pumping. This increases circulation and helps the milk flow. If your baby does not completely empty your breasts while breastfeeding, pump any extra milk after he or she is finished.  Apply ice packs to your breasts  immediately after breastfeeding or pumping, unless this is too uncomfortable for you. To do this: ? Put ice in a plastic bag. ? Place a towel between your skin and the bag. ? Leave the ice on for 20 minutes, 2-3 times a day.  Make sure that your baby is latched on and positioned properly while breastfeeding. If engorgement persists after 48 hours of following these recommendations, contact your health care provider or a Science writer. Overall health care recommendations while breastfeeding  Eat 3 healthy meals and 3 snacks every day. Well-nourished mothers who are breastfeeding need an additional 450-500 calories a day. You can meet this requirement by increasing the amount of a balanced diet that you eat.  Drink enough water to keep your urine pale yellow or clear.  Rest often, relax, and continue to take your prenatal vitamins to prevent fatigue, stress, and low vitamin and mineral levels in your body (nutrient deficiencies).  Do not use any products that contain nicotine or tobacco, such as cigarettes and e-cigarettes. Your baby may be harmed by chemicals from cigarettes that pass into breast milk and exposure to secondhand smoke. If you need help quitting, ask your health care provider.  Avoid alcohol.  Do not use illegal drugs or marijuana.  Talk with your health care provider before taking any medicines. These include over-the-counter and prescription medicines as well as vitamins and herbal supplements. Some medicines that may be harmful to your baby can pass through breast milk.  It is possible to become pregnant while breastfeeding. If birth control is desired, ask your health care provider about options that will be safe while breastfeeding your baby. Where to find more information: [employer]: www.llli.org Contact a health care provider if:  You feel  like you want to stop breastfeeding or have become frustrated with breastfeeding.  Your nipples are  cracked or bleeding.  Your breasts are red, tender, or warm.  You have: ? Painful breasts or nipples. ? A swollen area on either breast. ? A fever or chills. ? Nausea or vomiting. ? Drainage other than breast milk from your nipples.  Your breasts do not become full before feedings by the fifth day after you give birth.  You feel sad and depressed.  Your baby is: ? Too sleepy to eat well. ? Having trouble sleeping. ? More than 69 week old and wetting fewer than 6 diapers in a 24-hour period. ? Not gaining weight by 61 days of age.  Your baby has fewer than 3 stools in a 24-hour period.  Your baby's skin or the white parts of his or her eyes become yellow. Get help right away if:  Your baby is overly tired (lethargic) and does not want to wake up and feed.  Your baby develops an unexplained fever. Summary  Breastfeeding offers many health benefits for infant and mothers.  Try to breastfeed your infant when he or she shows early signs of hunger.  Gently tickle or stroke your baby's lips with your finger or nipple to allow the baby to open his or her mouth. Bring the baby to your breast. Make sure that much of the areola is in your baby's mouth. Offer one side and burp the baby before you offer the other side.  Talk with your health care provider or lactation consultant if you have questions or you face problems as you breastfeed. This information is not intended to replace advice given to you by your health care provider. Make sure you discuss any questions you have with your health care provider. Document Revised: 01/03/2018 Document Reviewed: 11/10/2016 Elsevier Patient Education  2021 [place] of Pregnancy  The third trimester of pregnancy is from week 28 through week 26. This is also called months 7 through 9. This trimester is when your unborn baby (fetus) is growing very fast. At the end of the ninth month, the unborn baby is about 20 inches long.  It weighs about 6-10 pounds. Body changes during your third trimester Your body continues to go through many changes during this time. The changes vary and generally return to normal after the baby is born. Physical changes  Your weight will continue to increase. You may gain 25-35 pounds (11-16 kg) by the end of the pregnancy. If you are underweight, you may gain 28-40 lb (about 13-18 kg). If you are overweight, you may gain 15-25 lb (about 7-11 kg).  You may start to get stretch marks on your hips, belly (abdomen), and breasts.  Your breasts will continue to grow and may hurt. A yellow fluid (colostrum) may leak from your breasts. This is the first milk you are making for your baby.  You may have changes in your hair.  Your belly button may stick out.  You may have more swelling in your hands, face, or ankles. Health changes  You may have heartburn.  You may have trouble pooping (constipation).  You may get hemorrhoids. These are swollen veins in the butt that can itch or get painful.  You may have swollen veins (varicose veins) in your legs.  You may have more body aches in the pelvis, back, or thighs.  You may have more tingling or numbness in your hands, arms, and legs. The skin  on your belly may also feel numb.  You may feel short of breath as your womb (uterus) gets bigger. Other changes  You may pee (urinate) more often.  You may have more problems sleeping.  You may notice the unborn baby "dropping," or moving lower in your belly.  You may have more discharge coming from your vagina.  Your joints may feel loose, and you may have pain around your pelvic bone. Follow these instructions at home: Medicines  Take over-the-counter and prescription medicines only as told by your doctor. Some medicines are not safe during pregnancy.  Take a prenatal vitamin that contains at least 600 micrograms (mcg) of folic acid. Eating and drinking  Eat healthy meals that  include: ? Fresh fruits and vegetables. ? Whole grains. ? Good sources of protein, such as meat, eggs, or tofu. ? Low-fat dairy products.  Avoid raw meat and unpasteurized juice, milk, and cheese. These carry germs that can harm you and your baby.  Eat 4 or 5 small meals rather than 3 large meals a day.  You may need to take these actions to prevent or treat trouble pooping: ? Drink enough fluids to keep your pee (urine) pale yellow. ? Eat foods that are high in fiber. These include beans, whole grains, and fresh fruits and vegetables. ? Limit foods that are high in fat and sugar. These include fried or sweet foods. Activity  Exercise only as told by your doctor. Stop exercising if you start to have cramps in your womb.  Avoid heavy lifting.  Do not exercise if it is too hot or too humid, or if you are in a place of great height (high altitude).  If you choose to, you may have sex unless your doctor tells you not to. Relieving pain and discomfort  Take breaks often, and rest with your legs raised (elevated) if you have leg cramps or low back pain.  Take warm water baths (sitz baths) to soothe pain or discomfort caused by hemorrhoids. Use hemorrhoid cream if your doctor approves.  Wear a good support bra if your breasts are tender.  If you develop bulging, swollen veins in your legs: ? Wear support hose as told by your doctor. ? Raise your feet for 15 minutes, 3-4 times a day. ? Limit salt in your food. Safety  Talk to your doctor before traveling far distances.  Do not use hot tubs, steam rooms, or saunas.  Wear your seat belt at all times when you are in a car.  Talk with your doctor if someone is hurting you or yelling at you a lot. Preparing for your baby's arrival To prepare for the arrival of your baby:  Take prenatal classes.  Visit the hospital and tour the maternity area.  Buy a rear-facing car seat. Learn how to install it in your car.  Prepare the baby's  room. Take out all pillows and stuffed animals from the baby's crib. General instructions  Avoid cat litter boxes and soil used by cats. These carry germs that can cause harm to the baby and can cause a loss of your baby by miscarriage or stillbirth.  Do not douche or use tampons. Do not use scented sanitary pads.  Do not smoke or use any products that contain nicotine or tobacco. If you need help quitting, ask your doctor.  Do not drink alcohol.  Do not use herbal medicines, illegal drugs, or medicines that were not approved by your doctor. Chemicals in these products can   affect your baby.  Keep all follow-up visits. This is important. Where to find more information  American Pregnancy Association: americanpregnancy.org  American College of Obstetricians and Gynecologists: www.acog.org  Office on Women's Health: womenshealth.gov/pregnancy Contact a doctor if:  You have a fever.  You have mild cramps or pressure in your lower belly.  You have a nagging pain in your belly area.  You vomit, or you have watery poop (diarrhea).  You have bad-smelling fluid coming from your vagina.  You have pain when you pee, or your pee smells bad.  You have a headache that does not go away when you take medicine.  You have changes in how you see, or you see spots in front of your eyes. Get help right away if:  Your water breaks.  You have regular contractions that are less than 5 minutes apart.  You are spotting or bleeding from your vagina.  You have very bad belly cramps or pain.  You have trouble breathing.  You have chest pain.  You faint.  You have not felt the baby move for the amount of time told by your doctor.  You have new or increased pain, swelling, or redness in an arm or leg. Summary  The third trimester is from week 28 through week 40 (months 7 through 9). This is the time when your unborn baby is growing very fast.  During this time, your discomfort may  increase as you gain weight and as your baby grows.  Get ready for your baby to arrive by taking prenatal classes, buying a rear-facing car seat, and preparing the baby's room.  Get help right away if you are bleeding from your vagina, you have chest pain and trouble breathing, or you have not felt the baby move for the amount of time told by your doctor. This information is not intended to replace advice given to you by your health care provider. Make sure you discuss any questions you have with your health care provider. Document Revised: 03/17/2020 Document Reviewed: 01/22/2020 Elsevier Patient Education  2021 [employer]  

## 2021-03-24 ENCOUNTER — Other Ambulatory Visit: Payer: Self-pay

## 2021-03-24 ENCOUNTER — Other Ambulatory Visit: Payer: Self-pay | Admitting: Obstetrics and Gynecology

## 2021-03-24 ENCOUNTER — Ambulatory Visit (INDEPENDENT_AMBULATORY_CARE_PROVIDER_SITE_OTHER): Payer: 59 | Admitting: Obstetrics and Gynecology

## 2021-03-24 ENCOUNTER — Encounter: Payer: Self-pay | Admitting: Obstetrics and Gynecology

## 2021-03-24 VITALS — BP 131/79 | HR 101 | Wt 171.0 lb

## 2021-03-24 DIAGNOSIS — Z3483 Encounter for supervision of other normal pregnancy, third trimester: Secondary | ICD-10-CM

## 2021-03-24 DIAGNOSIS — Z8709 Personal history of other diseases of the respiratory system: Secondary | ICD-10-CM

## 2021-03-24 DIAGNOSIS — R0602 Shortness of breath: Secondary | ICD-10-CM

## 2021-03-24 DIAGNOSIS — Z3A32 32 weeks gestation of pregnancy: Secondary | ICD-10-CM

## 2021-03-24 LAB — POCT URINALYSIS DIPSTICK OB
Bilirubin, UA: NEGATIVE
Blood, UA: NEGATIVE
Glucose, UA: NEGATIVE
Ketones, UA: NEGATIVE
Leukocytes, UA: NEGATIVE
Nitrite, UA: NEGATIVE
POC,PROTEIN,UA: NEGATIVE
Spec Grav, UA: 1.025 (ref 1.010–1.025)
Urobilinogen, UA: 0.2 E.U./dL
pH, UA: 6.5 (ref 5.0–8.0)

## 2021-03-24 MED ORDER — ALBUTEROL SULFATE HFA 108 (90 BASE) MCG/ACT IN AERS: 1.0000 | INHALATION_SPRAY | RESPIRATORY_TRACT | 2 refills | Status: DC | PRN

## 2021-03-24 NOTE — Progress Notes (Signed)
ROB: Patient notes SOB, sometimes even at rest.  Does report a remote h/o asthma in the past. Lung sounds normal today, no increased work of breathing. Discussed that it is most likely be due to small body habitus and advanced gestation, however can prescribe albuterol inhaler to see if this helps. Of note, she was seen in triage last week due to preterm contractions, ruled out for labor. Was treated with hydration. Is trying to work harder on maintaining adequate hydration. RTC in 2 weeks.

## 2021-03-24 NOTE — Progress Notes (Signed)
OB-pt present for routine prenatal care. Pt stated having lower abd/pelvic pressure and braxton hicks contractions.

## 2021-04-07 ENCOUNTER — Other Ambulatory Visit: Payer: Self-pay

## 2021-04-07 ENCOUNTER — Ambulatory Visit (INDEPENDENT_AMBULATORY_CARE_PROVIDER_SITE_OTHER): Payer: 59 | Admitting: Obstetrics and Gynecology

## 2021-04-07 VITALS — BP 130/80 | HR 94 | Wt 168.9 lb

## 2021-04-07 DIAGNOSIS — Z3483 Encounter for supervision of other normal pregnancy, third trimester: Secondary | ICD-10-CM

## 2021-04-07 DIAGNOSIS — Z3A34 34 weeks gestation of pregnancy: Secondary | ICD-10-CM

## 2021-04-07 LAB — POCT URINALYSIS DIPSTICK
Bilirubin, UA: NEGATIVE
Blood, UA: NEGATIVE
Glucose, UA: NEGATIVE
Ketones, UA: NEGATIVE
Leukocytes, UA: NEGATIVE
Nitrite, UA: NEGATIVE
Protein, UA: NEGATIVE
Spec Grav, UA: 1.02 (ref 1.010–1.025)
Urobilinogen, UA: 0.2 E.U./dL
pH, UA: 6 (ref 5.0–8.0)

## 2021-04-07 NOTE — Progress Notes (Signed)
ROB: Tired of being pregnant but generally good spirits.  No further issues with contraction pain.  Breathing better.  Cultures next visit.

## 2021-04-21 ENCOUNTER — Other Ambulatory Visit: Payer: Self-pay

## 2021-04-21 ENCOUNTER — Ambulatory Visit (INDEPENDENT_AMBULATORY_CARE_PROVIDER_SITE_OTHER): Payer: 59 | Admitting: Obstetrics and Gynecology

## 2021-04-21 ENCOUNTER — Encounter: Payer: Self-pay | Admitting: Obstetrics and Gynecology

## 2021-04-21 VITALS — BP 142/84 | HR 92 | Wt 175.8 lb

## 2021-04-21 DIAGNOSIS — Z3A36 36 weeks gestation of pregnancy: Secondary | ICD-10-CM

## 2021-04-21 DIAGNOSIS — Z3483 Encounter for supervision of other normal pregnancy, third trimester: Secondary | ICD-10-CM

## 2021-04-21 DIAGNOSIS — R030 Elevated blood-pressure reading, without diagnosis of hypertension: Secondary | ICD-10-CM

## 2021-04-21 DIAGNOSIS — R81 Glycosuria: Secondary | ICD-10-CM

## 2021-04-21 LAB — POCT URINALYSIS DIPSTICK OB
Bilirubin, UA: NEGATIVE
Blood, UA: NEGATIVE
Ketones, UA: 5
Leukocytes, UA: NEGATIVE
Nitrite, UA: NEGATIVE
Spec Grav, UA: 1.025 (ref 1.010–1.025)
Urobilinogen, UA: 0.2 E.U./dL
pH, UA: 6.5 (ref 5.0–8.0)

## 2021-04-21 NOTE — Progress Notes (Signed)
OB-Pt present for prenatal card 36 week cultures.  Pt c/o contractions and overall body pain.

## 2021-04-21 NOTE — Patient Instructions (Signed)

## 2021-04-21 NOTE — Progress Notes (Signed)
ROB: Notes diarrhea since yesterday, 5 episodes. Also noting some chills. Denies cough, or fever.  No other sick contacts. Ate peach yogurt and ginger ale due to nausea. BPs elevated. Advised on home medications. Also encouraged COVID testing if symptoms are not improved within the next 24-48 hrs or if they worsen. UA reviewed, glucosuria noted today with mildly elevated BP. Will monitor next week. 36 week labs performed.  RTC in 1 week.

## 2021-04-24 LAB — STREP GP B NAA: Strep Gp B NAA: POSITIVE — AB

## 2021-04-26 LAB — GC/CHLAMYDIA PROBE AMP
Chlamydia trachomatis, NAA: NEGATIVE
Neisseria Gonorrhoeae by PCR: NEGATIVE

## 2021-04-28 ENCOUNTER — Other Ambulatory Visit: Payer: Self-pay

## 2021-04-28 ENCOUNTER — Encounter: Payer: Self-pay | Admitting: Obstetrics and Gynecology

## 2021-04-28 ENCOUNTER — Ambulatory Visit (INDEPENDENT_AMBULATORY_CARE_PROVIDER_SITE_OTHER): Payer: 59 | Admitting: Obstetrics and Gynecology

## 2021-04-28 VITALS — BP 119/82 | HR 79 | Wt 177.1 lb

## 2021-04-28 DIAGNOSIS — L299 Pruritus, unspecified: Secondary | ICD-10-CM

## 2021-04-28 DIAGNOSIS — Z3A37 37 weeks gestation of pregnancy: Secondary | ICD-10-CM

## 2021-04-28 DIAGNOSIS — Z3483 Encounter for supervision of other normal pregnancy, third trimester: Secondary | ICD-10-CM

## 2021-04-28 LAB — POCT URINALYSIS DIPSTICK OB
Bilirubin, UA: NEGATIVE
Blood, UA: NEGATIVE
Glucose, UA: NEGATIVE
Ketones, UA: NEGATIVE
Leukocytes, UA: NEGATIVE
Nitrite, UA: NEGATIVE
POC,PROTEIN,UA: NEGATIVE
Spec Grav, UA: >=1.03 — AB (ref 1.010–1.025)
Urobilinogen, UA: 0.2 E.U./dL
pH, UA: 5 (ref 5.0–8.0)

## 2021-04-28 NOTE — Progress Notes (Signed)
ROB: Complains of itching of her hands abdomen and lower extremities.  Bile acid salts ordered.  Reports daily fetal movement.  GBS results discussed in detail.

## 2021-04-29 ENCOUNTER — Telehealth: Payer: Self-pay | Admitting: Obstetrics and Gynecology

## 2021-04-29 NOTE — Telephone Encounter (Signed)
Pt called asking about lab results that was collected yesterday. Please Advise.

## 2021-04-29 NOTE — Telephone Encounter (Signed)
Notified patient that labs have not resulted yet. She will watch her my chart for the results.

## 2021-04-30 LAB — BILE ACIDS, TOTAL: Bile Acids Total: 3.8 umol/L (ref 0.0–10.0)

## 2021-05-01 ENCOUNTER — Other Ambulatory Visit: Payer: Self-pay

## 2021-05-02 MED ORDER — ONDANSETRON HCL 4 MG PO TABS: 4.0000 mg | ORAL_TABLET | Freq: Three times a day (TID) | ORAL | 0 refills | Status: DC | PRN

## 2021-05-04 ENCOUNTER — Encounter: Payer: Self-pay | Admitting: Obstetrics and Gynecology

## 2021-05-04 ENCOUNTER — Ambulatory Visit (INDEPENDENT_AMBULATORY_CARE_PROVIDER_SITE_OTHER): Payer: 59 | Admitting: Obstetrics and Gynecology

## 2021-05-04 ENCOUNTER — Other Ambulatory Visit: Payer: Self-pay

## 2021-05-04 VITALS — BP 120/80 | HR 81 | Wt 178.5 lb

## 2021-05-04 DIAGNOSIS — Z3483 Encounter for supervision of other normal pregnancy, third trimester: Secondary | ICD-10-CM

## 2021-05-04 DIAGNOSIS — Z3A38 38 weeks gestation of pregnancy: Secondary | ICD-10-CM

## 2021-05-04 DIAGNOSIS — O26893 Other specified pregnancy related conditions, third trimester: Secondary | ICD-10-CM

## 2021-05-04 DIAGNOSIS — R102 Pelvic and perineal pain: Secondary | ICD-10-CM

## 2021-05-04 DIAGNOSIS — O479 False labor, unspecified: Secondary | ICD-10-CM

## 2021-05-04 LAB — POCT URINALYSIS DIPSTICK OB
Bilirubin, UA: NEGATIVE
Blood, UA: NEGATIVE
Glucose, UA: NEGATIVE
Ketones, UA: NEGATIVE
Leukocytes, UA: NEGATIVE
Nitrite, UA: NEGATIVE
Spec Grav, UA: >=1.03 — AB
Urobilinogen, UA: 0.2 U/dL
pH, UA: 6

## 2021-05-04 NOTE — Progress Notes (Signed)
OB-Pt present for routine prenatal care. Pt stated having lower abd pain and braxton hick contractions.

## 2021-05-04 NOTE — Patient Instructions (Signed)
Breastfeeding and Breast Care It is normal to have some problems when you start to breastfeed your new baby. But there are things that you can do to take care of yourself and help prevent problems. This includes keeping your breasts healthy and making sure that your baby's mouth attaches (latches) properly to your nipple for feedings. Work with your doctor or breastfeeding specialist to find what works best foryou. How does self-care benefit me? If you keep your breasts healthy and you let your baby attach to your nipples in the right way, you will avoid these problems: Cracked or sore nipples. Breasts becoming overfilled with milk. Plugged milk ducts. Low milk supply. Breast swelling or infection. How does self-care benefit my baby? By preventing problems with your breasts, you will ensure that your baby willfeed well and will gain the right amount of weight. What actions can I take to care for myself during breastfeeding? Best ways to breastfeed Always make sure that your baby latches properly to breastfeed. Make sure that your baby is in a proper position. Try different breastfeeding positions to find one that works best for you and your baby. Breastfeed when you feel like you need to make your breasts less full or when your baby shows signs of hunger. This is called "breastfeeding on demand." Do not delay feedings. Try to relax when it is time to feed your baby. This helps your body release milk from your breast. To help increase milk flow, do these things before feeding: Remove a small amount of milk from your breast. Use a pump or squeeze with your hand. Apply warm, moist heat to your breast. Do this in the shower or use hand towels soaked with warm water. Massage your breasts. Do this when you are breastfeeding as well. Caring for your breasts     To help your breasts stay healthy and keep them from getting too dry: Avoid using soap on your nipples. Let your nipples air-dry for  3-4 minutes after each feeding. Do not use things like a hair dryer to dry your breasts. This can make the skin dry and will cause irritation and pain. Use only cotton bra pads to soak up breast milk that leaks. Change the pads if they become soaked with milk. If you use bra pads that can be thrown away, change them often. Put some lanolin on your nipples after breastfeeding. Pure lanolin does not need to be washed off your nipple before you feed your baby again. Pure lanolin is not harmful to your baby. Rub some breast milk into your nipples: Use your hand to squeeze out a few drops of breast milk. Gently massage the milk into your nipples. Let your nipples air-dry. Wear a supportive nursing bra. Avoid wearing: Tight clothing. Underwire bras or bras that put pressure on your breasts. Use ice to help relieve pain or swelling of your breasts: Put ice in a plastic bag. Place a towel between your skin and the bag. Leave the ice on for 20 minutes, 2-3 times a day. Follow these instructions at home: Drink enough fluid to keep your pee (urine) pale yellow. Get plenty of rest. Sleep when your baby sleeps. Talk to your doctor or breastfeeding specialist before taking any herbal supplements. Eat a balanced diet. This includes fruits, vegetables, whole grains, lean proteins, and dairy or dairy alternatives Contact a health care provider if: You have nipple pain. You have cracking or soreness in your nipples that lasts longer than 1 week. Your breasts are  overfilled with milk, and this lasts longer than 48 hours. You have a fever. You have pus-like fluid coming from your nipple. You have redness, a rash, swelling, itching, or burning on your breast. Your baby does not gain weight. Your baby loses weight. Your baby is not feeding regularly or is very sleepy and lacks energy. Summary There are things that you can do to take care of yourself and help prevent many common breastfeeding problems. Always  make sure that your baby's mouth attaches (latches) to your nipple properly to breastfeed. Keep your nipples from getting too dry, drink plenty of fluid, and get plenty of rest. Feed on demand. Do not delay feedings. This information is not intended to replace advice given to you by your health care provider. Make sure you discuss any questions you have with your health care provider. Document Revised: 03/30/2020 Document Reviewed: 03/30/2020 Elsevier Patient Education  2022 [employer]    Common Medications Safe in Pregnancy  Acne:      Constipation:  Benzoyl Peroxide     Colace  Clindamycin      Dulcolax Suppository  Topica Erythromycin     Fibercon  Salicylic Acid      Metamucil         Miralax AVOID:        Senakot   Accutane    Cough:  Retin-A       Cough Drops  Tetracycline      Phenergan w/ Codeine if Rx  Minocycline      Robitussin (Plain & DM)  Antibiotics:     Crabs/Lice:  Ceclor       RID  Cephalosporins    AVOID:  E-Mycins      Kwell  Keflex  Macrobid/Macrodantin   Diarrhea:  Penicillin      Kao-Pectate  Zithromax      Imodium AD         PUSH FLUIDS AVOID:       Cipro     Fever:  Tetracycline      Tylenol (Regular or Extra  Minocycline       Strength)  Levaquin      Extra Strength-Do not          Exceed 8 tabs/24 hrs Caffeine:        <200mg/day (equiv. To 1 cup of coffee or  approx. 3 12 oz sodas)         Gas: Cold/Hayfever:       Gas-X  Benadryl      Mylicon  Claritin       Phazyme  **Claritin-D        Chlor-Trimeton    Headaches:  Dimetapp      ASA-Free Excedrin  Drixoral-Non-Drowsy     Cold Compress  Mucinex (Guaifenasin)     Tylenol (Regular or Extra  Sudafed/Sudafed-12 Hour     Strength)  **Sudafed PE Pseudoephedrine   Tylenol Cold & Sinus     Vicks Vapor Rub  Zyrtec  **AVOID if Problems With Blood Pressure         Heartburn: Avoid lying down for at least 1 hour after meals  Aciphex      Maalox     Rash:  Milk of  Magnesia     Benadryl    Mylanta       1% Hydrocortisone Cream  Pepcid  Pepcid Complete   Sleep Aids:  Prevacid      Ambien   Prilosec       Benadryl    Chamomile Tea  Tums (Limit 4/day)     Unisom         Tylenol PM         Warm milk-add vanilla or  Hemorrhoids:       Sugar for taste  Anusol/Anusol H.C.  (RX: Analapram 2.5%)  Sugar Substitutes:  Hydrocortisone OTC     Ok in moderation  Preparation H      Tucks        Vaseline lotion applied to tissue with wiping    Herpes:     Throat:  Acyclovir      Oragel  Famvir  Valtrex     Vaccines:         Flu Shot Leg Cramps:       *Gardasil  Benadryl      Hepatitis A         Hepatitis B Nasal Spray:       Pneumovax  Saline Nasal Spray     Polio Booster         Tetanus Nausea:       Tuberculosis test or PPD  Vitamin B6 25 mg TID   AVOID:    Dramamine      *Gardasil  Emetrol       Live Poliovirus  Ginger Root 250 mg QID    MMR (measles, mumps &  High Complex Carbs @ Bedtime    rebella)  Sea Bands-Accupressure    Varicella (Chickenpox)  Unisom 1/2 tab TID     *No known complications           If received before Pain:         Known pregnancy;   Darvocet       Resume series after  Lortab        Delivery  Percocet    Yeast:   Tramadol      Femstat  Tylenol 3      Gyne-lotrimin  Ultram       Monistat  Vicodin           MISC:         All Sunscreens           Hair Coloring/highlights          Insect Repellant's          (Including DEET)         Mystic Tans

## 2021-05-04 NOTE — Progress Notes (Signed)
ROB: Notes pelvic pressure and Braxton Hicks. Reiterated labor precautions. RTC in 1 week.

## 2021-05-11 ENCOUNTER — Ambulatory Visit (INDEPENDENT_AMBULATORY_CARE_PROVIDER_SITE_OTHER): Payer: 59 | Admitting: Obstetrics and Gynecology

## 2021-05-11 ENCOUNTER — Encounter: Payer: Self-pay | Admitting: Obstetrics and Gynecology

## 2021-05-11 ENCOUNTER — Other Ambulatory Visit: Payer: Self-pay

## 2021-05-11 VITALS — BP 115/76 | HR 90 | Wt 178.2 lb

## 2021-05-11 DIAGNOSIS — Z3483 Encounter for supervision of other normal pregnancy, third trimester: Secondary | ICD-10-CM

## 2021-05-11 DIAGNOSIS — Z3A39 39 weeks gestation of pregnancy: Secondary | ICD-10-CM

## 2021-05-11 LAB — POCT URINALYSIS DIPSTICK OB
Bilirubin, UA: NEGATIVE
Blood, UA: NEGATIVE
Glucose, UA: NEGATIVE
Ketones, UA: NEGATIVE
Leukocytes, UA: NEGATIVE
Nitrite, UA: NEGATIVE
POC,PROTEIN,UA: NEGATIVE
Spec Grav, UA: 1.01 (ref 1.010–1.025)
Urobilinogen, UA: 0.2 E.U./dL
pH, UA: 8 (ref 5.0–8.0)

## 2021-05-11 NOTE — Progress Notes (Signed)
ROB: States that she is having regular contractions every day.  Signs and symptoms of labor reviewed.  Induction scheduled for Friday 29 at midnight.  COVID testing next week.  NST 1 week for postdates.

## 2021-05-17 ENCOUNTER — Encounter: Payer: Self-pay | Admitting: Obstetrics and Gynecology

## 2021-05-17 ENCOUNTER — Inpatient Hospital Stay
Admission: EM | Admit: 2021-05-17 | Discharge: 2021-05-19 | DRG: 805 | Disposition: A | Discharge status: Home / Self Care | Payer: 59 | Attending: Obstetrics and Gynecology | Admitting: Obstetrics and Gynecology

## 2021-05-17 ENCOUNTER — Ambulatory Visit (INDEPENDENT_AMBULATORY_CARE_PROVIDER_SITE_OTHER): Payer: 59 | Admitting: Obstetrics and Gynecology

## 2021-05-17 ENCOUNTER — Other Ambulatory Visit: Payer: Self-pay

## 2021-05-17 ENCOUNTER — Other Ambulatory Visit: Payer: 59

## 2021-05-17 DIAGNOSIS — O480 Post-term pregnancy: Principal | ICD-10-CM | POA: Diagnosis present

## 2021-05-17 DIAGNOSIS — O99892 Other specified diseases and conditions complicating childbirth: Secondary | ICD-10-CM | POA: Diagnosis not present

## 2021-05-17 DIAGNOSIS — U071 COVID-19: Secondary | ICD-10-CM | POA: Diagnosis present

## 2021-05-17 DIAGNOSIS — Z3480 Encounter for supervision of other normal pregnancy, unspecified trimester: Secondary | ICD-10-CM

## 2021-05-17 DIAGNOSIS — Z3490 Encounter for supervision of normal pregnancy, unspecified, unspecified trimester: Secondary | ICD-10-CM

## 2021-05-17 DIAGNOSIS — Z3A40 40 weeks gestation of pregnancy: Secondary | ICD-10-CM

## 2021-05-17 DIAGNOSIS — O99824 Streptococcus B carrier state complicating childbirth: Secondary | ICD-10-CM | POA: Diagnosis present

## 2021-05-17 DIAGNOSIS — T40495A Adverse effect of other synthetic narcotics, initial encounter: Secondary | ICD-10-CM | POA: Diagnosis not present

## 2021-05-17 DIAGNOSIS — R4182 Altered mental status, unspecified: Secondary | ICD-10-CM | POA: Diagnosis not present

## 2021-05-17 DIAGNOSIS — O98519 Other viral diseases complicating pregnancy, unspecified trimester: Secondary | ICD-10-CM | POA: Diagnosis present

## 2021-05-17 DIAGNOSIS — F19959 Other psychoactive substance use, unspecified with psychoactive substance-induced psychotic disorder, unspecified: Secondary | ICD-10-CM

## 2021-05-17 DIAGNOSIS — R102 Pelvic and perineal pain: Secondary | ICD-10-CM | POA: Diagnosis present

## 2021-05-17 DIAGNOSIS — O98513 Other viral diseases complicating pregnancy, third trimester: Secondary | ICD-10-CM | POA: Diagnosis not present

## 2021-05-17 DIAGNOSIS — O9852 Other viral diseases complicating childbirth: Secondary | ICD-10-CM | POA: Diagnosis present

## 2021-05-17 DIAGNOSIS — Y92230 Patient room in hospital as the place of occurrence of the external cause: Secondary | ICD-10-CM | POA: Diagnosis present

## 2021-05-17 NOTE — OB Triage Note (Signed)
[facility] to [facility] with complaints of ctx. Pt states ctx started around 2100 and are about 2 mins a part. Pt denies vaginal bleeding and LOF. Pt states positive FM. Monitors applied and assessing.

## 2021-05-17 NOTE — Progress Notes (Signed)
Televisit Appointment  OB-Pt televisit for routine prenatal care. Pt tested positive for COVID. Pt stated fetal movement present; braxton hick contractions present; no vaginal bleeding and no changes in vaginal discharge. Pt c/o lower abd and pelvic pain and pressure.

## 2021-05-17 NOTE — Progress Notes (Signed)
Virtual Visit via Telephone Note  I connected with Nancy Brown on 05/17/21 at 12:02 PM EDT by telephone and verified that I am speaking with the correct person using two identifiers.  Location: Patient: Home Provider: Office   I discussed the limitations, risks, security and privacy concerns of performing an evaluation and management service by telephone and the availability of in person appointments. I also discussed with the patient that there may be a patient responsible charge related to this service. The patient expressed understanding and agreed to proceed.   History of Present Illness: Nancy Brown is a 25 y.o. G2P0010 female who presents for Televisit OB appointment due to recently contracting COVID.  She notes positive symptoms, and was tested on this past Friday. Overall notes feeling ok. Has some mild congestion but otherwise doing well. Notes good fetal movement. Denies contractions, vaginal bleeding. Has questions regarding her scheduled induction on Thursday.    Observations/Objective: Last menstrual period 06/13/2020. Gen App: patient well-sounding, no distress Remainder of exam deferred.   Assessment and Plan: 25 y.o. G2P0010 at [redacted]w[redacted]d. - Scheduled for IOL on Thursday. All questions answered.   COVID positive - instructed on self-care while pregnant, proper cleaning hygiene and self quarantine until induction. Discussed protocol for visitors during her hospitalization. Advised on home medicatinos if needed.   Follow Up Instructions:    I discussed the assessment and treatment plan with the patient. The patient was provided an opportunity to ask questions and all were answered. The patient agreed with the plan and demonstrated an understanding of the instructions.   The patient was advised to call back or seek an in-person evaluation if the symptoms worsen or if the condition fails to improve as anticipated.  I provided 11 minutes of non-face-to-face time during this  encounter.   Hildred Laser, MD [facility]

## 2021-05-17 NOTE — Patient Instructions (Signed)
Signs and Symptoms of Labor Labor is the body's natural process of moving the baby and the placenta out of the uterus. The process of labor usually starts when the baby is full-term,between 37 and 40 weeks of pregnancy. Signs and symptoms that you are close to going into labor As your body prepares for labor and the birth of your baby, you may notice the following symptoms in the weeks and days before true labor starts: Passing a small amount of thick, bloody mucus from your vagina. This is called normal bloody show or losing your mucus plug. This may happen more than a week before labor begins, or right before labor begins, as the opening of the cervix starts to widen (dilate). For some women, the entire mucus plug passes at once. For others, pieces of the mucus plug may gradually pass over several days. Your baby moving (dropping) lower in your pelvis to get into position for birth (lightening). When this happens, you may feel more pressure on your bladder and pelvic bone and less pressure on your ribs. This may make it easier to breathe. It may also cause you to need to urinate more often and have problems with bowel movements. Having "practice contractions," also called Braxton Hicks contractions or false labor. These occur at irregular (unevenly spaced) intervals that are more than 10 minutes apart. False labor contractions are common after exercise or sexual activity. They will stop if you change position, rest, or drink fluids. These contractions are usually mild and do not get stronger over time. They may feel like: A backache or back pain. Mild cramps, similar to menstrual cramps. Tightening or pressure in your abdomen. Other early symptoms include: Nausea or loss of appetite. Diarrhea. Having a sudden burst of energy, or feeling very tired. Mood changes. Having trouble sleeping. Signs and symptoms that labor has begun Signs that you are in labor may include: Having contractions that come  at regular (evenly spaced) intervals and increase in intensity. This may feel like more intense tightening or pressure in your abdomen that moves to your back. Contractions may also feel like rhythmic pain in your upper thighs or back that comes and goes at regular intervals. For first-time mothers, this change in intensity of contractions often occurs at a more gradual pace. Women who have given birth before may notice a more rapid progression of contraction changes. Feeling pressure in the vaginal area. Your water breaking (rupture of membranes). This is when the sac of fluid that surrounds your baby breaks. Fluid leaking from your vagina may be clear or blood-tinged. Labor usually starts within 24 hours of your water breaking, but it may take longer to begin. Some women may feel a sudden gush of fluid. Others notice that their underwear repeatedly becomes damp. Follow these instructions at home:  When labor starts, or if your water breaks, call your health care provider or nurse care line. Based on your situation, they will determine when you should go in for an exam. During early labor, you may be able to rest and manage symptoms at home. Some strategies to try at home include: Breathing and relaxation techniques. Taking a warm bath or shower. Listening to music. Using a heating pad on the lower back for pain. If you are directed to use heat: Place a towel between your skin and the heat source. Leave the heat on for 20-30 minutes. Remove the heat if your skin turns bright red. This is especially important if you are unable to feel  pain, heat, or cold. You may have a greater risk of getting burned. Contact a health care provider if: Your labor has started. Your water breaks. Get help right away if: You have painful, regular contractions that are 5 minutes apart or less. Labor starts before you are [redacted] weeks along in your pregnancy. You have a fever. You have bright red blood coming from  your vagina. You do not feel your baby moving. You have a severe headache with or without vision problems. You have severe nausea, vomiting, or diarrhea. You have chest pain or shortness of breath. These symptoms may represent a serious problem that is an emergency. Do not wait to see if the symptoms will go away. Get medical help right away. Call your local emergency services (911 in the [place] Do not drive yourself to the hospital. Summary Labor is your body's natural process of moving your baby and the placenta out of your uterus. The process of labor usually starts when your baby is full-term, between 62 and 40 weeks of pregnancy. When labor starts, or if your water breaks, call your health care provider or nurse care line. Based on your situation, they will determine when you should go in for an exam. This information is not intended to replace advice given to you by your health care provider. Make sure you discuss any questions you have with your healthcare provider. Document Revised: 07/31/2020 Document Reviewed: 07/31/2020 Elsevier Patient Education  [place]. Common Medications Safe in Pregnancy  Acne:      Constipation:  Benzoyl Peroxide     Colace  Clindamycin      Dulcolax Suppository  Topica Erythromycin     Fibercon  Salicylic Acid      Metamucil         Miralax AVOID:        Senakot   Accutane    Cough:  Retin-A       Cough Drops  Tetracycline      Phenergan w/ Codeine if Rx  Minocycline      Robitussin (Plain & DM)  Antibiotics:     Crabs/Lice:  Ceclor       RID  Cephalosporins    AVOID:  E-Mycins      Kwell  Keflex  Macrobid/Macrodantin   Diarrhea:  Penicillin      Kao-Pectate  Zithromax      Imodium AD         PUSH FLUIDS AVOID:       Cipro     Fever:  Tetracycline      Tylenol (Regular or Extra  Minocycline       Strength)  Levaquin      Extra Strength-Do not          Exceed 8 tabs/24 hrs Caffeine:        <241m/day (equiv. To 1 cup of coffee  or  approx. 3 12 oz sodas)         Gas: Cold/Hayfever:       Gas-X  Benadryl      Mylicon  Claritin       Phazyme  **Claritin-D        Chlor-Trimeton    Headaches:  Dimetapp      ASA-Free Excedrin  Drixoral-Non-Drowsy     Cold Compress  Mucinex (Guaifenasin)     Tylenol (Regular or Extra  Sudafed/Sudafed-12 Hour     Strength)  **Sudafed PE Pseudoephedrine   Tylenol Cold & Sinus     Vicks Vapor  Rub  Zyrtec  **AVOID if Problems With Blood Pressure         Heartburn: Avoid lying down for at least 1 hour after meals  Aciphex      Maalox     Rash:  Milk of Magnesia     Benadryl    Mylanta       1% Hydrocortisone Cream  Pepcid  Pepcid Complete   Sleep Aids:  Prevacid      Ambien   Prilosec       Benadryl  Rolaids       Chamomile Tea  Tums (Limit 4/day)     Unisom         Tylenol PM         Warm milk-add vanilla or  Hemorrhoids:       Sugar for taste  Anusol/Anusol H.C.  (RX: Analapram 2.5%)  Sugar Substitutes:  Hydrocortisone OTC     Ok in moderation  Preparation H      Tucks        Vaseline lotion applied to tissue with wiping    Herpes:     Throat:  Acyclovir      Oragel  Famvir  Valtrex     Vaccines:         Flu Shot Leg Cramps:       *Gardasil  Benadryl      Hepatitis A         Hepatitis B Nasal Spray:       Pneumovax  Saline Nasal Spray     Polio Booster         Tetanus Nausea:       Tuberculosis test or PPD  Vitamin B6 25 mg TID   AVOID:    Dramamine      *Gardasil  Emetrol       Live Poliovirus  Ginger Root 250 mg QID    MMR (measles, mumps &  High Complex Carbs @ Bedtime    rebella)  Sea Bands-Accupressure    Varicella (Chickenpox)  Unisom 1/2 tab TID     *No known complications           If received before Pain:         Known pregnancy;   Darvocet       Resume series after  Lortab        Delivery  Percocet    Yeast:   Tramadol      Femstat  Tylenol 3      Gyne-lotrimin  Ultram       Monistat  Vicodin           MISC:         All  Sunscreens           Hair Coloring/highlights          Insect Repellant's          (Including DEET)         Mystic Tans

## 2021-05-18 ENCOUNTER — Inpatient Hospital Stay: Payer: 59 | Admitting: Anesthesiology

## 2021-05-18 ENCOUNTER — Encounter: Payer: Self-pay | Admitting: Obstetrics and Gynecology

## 2021-05-18 DIAGNOSIS — F19959 Other psychoactive substance use, unspecified with psychoactive substance-induced psychotic disorder, unspecified: Secondary | ICD-10-CM

## 2021-05-18 DIAGNOSIS — U071 COVID-19: Secondary | ICD-10-CM | POA: Diagnosis present

## 2021-05-18 DIAGNOSIS — O99824 Streptococcus B carrier state complicating childbirth: Secondary | ICD-10-CM | POA: Diagnosis present

## 2021-05-18 DIAGNOSIS — O480 Post-term pregnancy: Secondary | ICD-10-CM | POA: Diagnosis present

## 2021-05-18 DIAGNOSIS — Y92230 Patient room in hospital as the place of occurrence of the external cause: Secondary | ICD-10-CM | POA: Diagnosis present

## 2021-05-18 DIAGNOSIS — R4182 Altered mental status, unspecified: Secondary | ICD-10-CM | POA: Diagnosis not present

## 2021-05-18 DIAGNOSIS — O9852 Other viral diseases complicating childbirth: Secondary | ICD-10-CM | POA: Diagnosis present

## 2021-05-18 DIAGNOSIS — T40495A Adverse effect of other synthetic narcotics, initial encounter: Secondary | ICD-10-CM | POA: Diagnosis not present

## 2021-05-18 DIAGNOSIS — Z3A40 40 weeks gestation of pregnancy: Secondary | ICD-10-CM | POA: Diagnosis not present

## 2021-05-18 DIAGNOSIS — O26893 Other specified pregnancy related conditions, third trimester: Secondary | ICD-10-CM | POA: Diagnosis present

## 2021-05-18 DIAGNOSIS — O99892 Other specified diseases and conditions complicating childbirth: Secondary | ICD-10-CM | POA: Diagnosis not present

## 2021-05-18 LAB — TYPE AND SCREEN
ABO/RH(D): O POS
Antibody Screen: NEGATIVE

## 2021-05-18 LAB — CBC
HCT: 38.6 % (ref 36.0–46.0)
Hemoglobin: 13.3 g/dL (ref 12.0–15.0)
MCH: 29.6 pg (ref 26.0–34.0)
MCHC: 34.5 g/dL (ref 30.0–36.0)
MCV: 86 fL (ref 80.0–100.0)
Platelets: 252 10*3/uL (ref 150–400)
RBC: 4.49 MIL/uL (ref 3.87–5.11)
RDW: 13.9 % (ref 11.5–15.5)
WBC: 8.9 10*3/uL (ref 4.0–10.5)
nRBC: 0 % (ref 0.0–0.2)

## 2021-05-18 LAB — RPR: RPR Ser Ql: NONREACTIVE

## 2021-05-18 LAB — ABO/RH: ABO/RH(D): O POS

## 2021-05-18 LAB — GLUCOSE, CAPILLARY: Glucose-Capillary: 104 mg/dL — ABNORMAL HIGH (ref 70–99)

## 2021-05-18 MED ORDER — IBUPROFEN 600 MG PO TABS
600.0000 mg | ORAL_TABLET | Freq: Four times a day (QID) | ORAL | Status: DC
  Administered 2021-05-18 – 2021-05-19 (×5): 600 mg via ORAL
  Filled 2021-05-18 (×6): qty 1

## 2021-05-18 MED ORDER — EPHEDRINE 5 MG/ML INJ: 10.0000 mg | INTRAVENOUS | Status: DC | PRN

## 2021-05-18 MED ORDER — COCONUT OIL OIL: 1.0000 "application " | TOPICAL_OIL | Status: DC | PRN

## 2021-05-18 MED ORDER — ZOLPIDEM TARTRATE 5 MG PO TABS: 5.0000 mg | ORAL_TABLET | Freq: Every evening | ORAL | Status: DC | PRN

## 2021-05-18 MED ORDER — LACTATED RINGERS IV SOLN: INTRAVENOUS | Status: DC

## 2021-05-18 MED ORDER — ONDANSETRON HCL 4 MG/2ML IJ SOLN: 4.0000 mg | Freq: Four times a day (QID) | INTRAMUSCULAR | Status: DC | PRN

## 2021-05-18 MED ORDER — BUPIVACAINE HCL (PF) 0.5 % IJ SOLN
INTRAMUSCULAR | Status: DC | PRN
  Administered 2021-05-18: .5 mL via INTRATHECAL

## 2021-05-18 MED ORDER — OXYTOCIN BOLUS FROM INFUSION
333.0000 mL | Freq: Once | INTRAVENOUS | Status: AC
  Administered 2021-05-18: 333 mL via INTRAVENOUS

## 2021-05-18 MED ORDER — PHENYLEPHRINE 40 MCG/ML (10ML) SYRINGE FOR IV PUSH (FOR BLOOD PRESSURE SUPPORT): 80.0000 ug | PREFILLED_SYRINGE | INTRAVENOUS | Status: DC | PRN

## 2021-05-18 MED ORDER — SOD CITRATE-CITRIC ACID 500-334 MG/5ML PO SOLN: 30.0000 mL | ORAL | Status: DC | PRN

## 2021-05-18 MED ORDER — SIMETHICONE 80 MG PO CHEW: 80.0000 mg | CHEWABLE_TABLET | ORAL | Status: DC | PRN

## 2021-05-18 MED ORDER — OXYTOCIN-SODIUM CHLORIDE 30-0.9 UT/500ML-% IV SOLN
2.5000 [IU]/h | INTRAVENOUS | Status: DC
  Administered 2021-05-18: 2.5 [IU]/h via INTRAVENOUS
  Filled 2021-05-18: qty 500

## 2021-05-18 MED ORDER — BENZOCAINE-MENTHOL 20-0.5 % EX AERO
1.0000 "application " | INHALATION_SPRAY | CUTANEOUS | Status: DC | PRN
  Administered 2021-05-18: 1 via TOPICAL
  Filled 2021-05-18 (×2): qty 56

## 2021-05-18 MED ORDER — WITCH HAZEL-GLYCERIN EX PADS
1.0000 "application " | MEDICATED_PAD | CUTANEOUS | Status: DC | PRN
  Administered 2021-05-18: 1 via TOPICAL
  Filled 2021-05-18 (×2): qty 100

## 2021-05-18 MED ORDER — SENNOSIDES-DOCUSATE SODIUM 8.6-50 MG PO TABS
2.0000 | ORAL_TABLET | Freq: Every day | ORAL | Status: DC
  Administered 2021-05-19: 2 via ORAL
  Filled 2021-05-18: qty 2

## 2021-05-18 MED ORDER — ACETAMINOPHEN 325 MG PO TABS
650.0000 mg | ORAL_TABLET | ORAL | Status: DC | PRN
  Administered 2021-05-18 (×3): 650 mg via ORAL
  Filled 2021-05-18 (×3): qty 2

## 2021-05-18 MED ORDER — DIBUCAINE (PERIANAL) 1 % EX OINT
1.0000 "application " | TOPICAL_OINTMENT | CUTANEOUS | Status: DC | PRN
  Administered 2021-05-18: 1 via RECTAL
  Filled 2021-05-18 (×2): qty 28

## 2021-05-18 MED ORDER — OXYTOCIN 10 UNIT/ML IJ SOLN
INTRAMUSCULAR | Status: AC
  Filled 2021-05-18: qty 2

## 2021-05-18 MED ORDER — LACTATED RINGERS IV SOLN: 500.0000 mL | INTRAVENOUS | Status: DC | PRN

## 2021-05-18 MED ORDER — SODIUM CHLORIDE 0.9 % IV SOLN
2.0000 g | Freq: Once | INTRAVENOUS | Status: AC
  Administered 2021-05-18: 2 g via INTRAVENOUS
  Filled 2021-05-18: qty 2000

## 2021-05-18 MED ORDER — ONDANSETRON HCL 4 MG PO TABS: 4.0000 mg | ORAL_TABLET | ORAL | Status: DC | PRN

## 2021-05-18 MED ORDER — LACTATED RINGERS IV SOLN
500.0000 mL | Freq: Once | INTRAVENOUS | Status: AC
  Administered 2021-05-18: 500 mL via INTRAVENOUS

## 2021-05-18 MED ORDER — FENTANYL-BUPIVACAINE-NACL 0.5-0.125-0.9 MG/250ML-% EP SOLN
12.0000 mL/h | EPIDURAL | Status: DC | PRN
  Administered 2021-05-18: 12 mL/h via EPIDURAL

## 2021-05-18 MED ORDER — SODIUM CHLORIDE 0.9 % IV SOLN
1.0000 g | INTRAVENOUS | Status: DC
  Administered 2021-05-18: 1 g via INTRAVENOUS
  Filled 2021-05-18: qty 1000

## 2021-05-18 MED ORDER — FENTANYL-BUPIVACAINE-NACL 0.5-0.125-0.9 MG/250ML-% EP SOLN
EPIDURAL | Status: AC
  Filled 2021-05-18: qty 250

## 2021-05-18 MED ORDER — EPHEDRINE 5 MG/ML INJ
10.0000 mg | INTRAVENOUS | Status: DC | PRN
Start: 2021-05-18 — End: 2021-05-18

## 2021-05-18 MED ORDER — PRENATAL MULTIVITAMIN CH
1.0000 | ORAL_TABLET | Freq: Every day | ORAL | Status: DC
  Administered 2021-05-18 – 2021-05-19 (×2): 1 via ORAL
  Filled 2021-05-18 (×2): qty 1

## 2021-05-18 MED ORDER — ACETAMINOPHEN 325 MG PO TABS: 650.0000 mg | ORAL_TABLET | ORAL | Status: DC | PRN

## 2021-05-18 MED ORDER — NALOXONE HCL 0.4 MG/ML IJ SOLN
INTRAMUSCULAR | Status: AC
  Filled 2021-05-18: qty 1

## 2021-05-18 MED ORDER — OXYTOCIN-SODIUM CHLORIDE 30-0.9 UT/500ML-% IV SOLN
INTRAVENOUS | Status: AC
  Filled 2021-05-18: qty 500

## 2021-05-18 MED ORDER — MISOPROSTOL 200 MCG PO TABS
ORAL_TABLET | ORAL | Status: AC
  Filled 2021-05-18: qty 4

## 2021-05-18 MED ORDER — DIPHENHYDRAMINE HCL 50 MG/ML IJ SOLN: 12.5000 mg | INTRAMUSCULAR | Status: DC | PRN

## 2021-05-18 MED ORDER — LIDOCAINE HCL (PF) 1 % IJ SOLN
INTRAMUSCULAR | Status: DC | PRN
  Administered 2021-05-18: 3 mL via SUBCUTANEOUS

## 2021-05-18 MED ORDER — ONDANSETRON HCL 4 MG/2ML IJ SOLN: 4.0000 mg | INTRAMUSCULAR | Status: DC | PRN

## 2021-05-18 MED ORDER — AMMONIA AROMATIC IN INHA
RESPIRATORY_TRACT | Status: AC
  Filled 2021-05-18: qty 10

## 2021-05-18 MED ORDER — BUTORPHANOL TARTRATE 1 MG/ML IJ SOLN
1.0000 mg | INTRAMUSCULAR | Status: DC | PRN
  Administered 2021-05-18: 1 mg via INTRAVENOUS
  Filled 2021-05-18: qty 1

## 2021-05-18 MED ORDER — LIDOCAINE-EPINEPHRINE (PF) 2 %-1:200000 IJ SOLN
INTRAMUSCULAR | Status: DC | PRN
  Administered 2021-05-18: 3 mL via EPIDURAL

## 2021-05-18 MED ORDER — NALOXONE HCL 2 MG/2ML IJ SOSY
PREFILLED_SYRINGE | INTRAMUSCULAR | Status: AC
  Filled 2021-05-18: qty 2

## 2021-05-18 MED ORDER — DIPHENHYDRAMINE HCL 25 MG PO CAPS: 25.0000 mg | ORAL_CAPSULE | Freq: Four times a day (QID) | ORAL | Status: DC | PRN

## 2021-05-18 MED ORDER — LIDOCAINE HCL (PF) 1 % IJ SOLN
30.0000 mL | INTRAMUSCULAR | Status: DC | PRN
  Filled 2021-05-18: qty 30

## 2021-05-18 NOTE — Progress Notes (Signed)
   LABOR NOTE     Nancy Brown 25 y.o.GP@ at [redacted]w[redacted]d  Immediately after Stadol was given for pain relief, I was called because pt started "eye rolling" and head nodding and was not appropriately responsive to verbal or physical stimuli.  Rapid response team was called.  I presented to bedside and found the patient nodding her head, shaking and verbalizing incoherently. Not saying any words just making apparently random sounds. BP's were stable.  1mg  of Narcan was ordered and a fluid bolus of was ordered and given. Within 30 seconds of pushing the Narcan, the patient became responsive, coherent and started to answer questions appropriately. She then immediately began to request pain relief for "vaginal burning" and back pain. Cervical exam by nursing revealed 9cm dilation.  Pt and her mother requested epidural for delivery.  Anes was called and an epidural was rapidly placed with good pain relief noted.  Several minutes after her pain relief she was found to be completely dilated.  AROM - small amount of clear noted.  Pt began pushing.  Analgesia: Epidural  OBJECTIVE:  BP (!) 121/40   Pulse 98   Temp 97.7 F (36.5 C) (Oral)   Resp 18   Ht 5\' 5"  (1.651 m)   Wt 81.6 kg   LMP 06/13/2020   SpO2 100%   BMI 29.95 kg/m  No intake/output data recorded.  She has shown cervical change. CERVIX: :  :   :   :    SVE:   Dilation: 10 Effacement (%): 100 Station: Plus 1 Exam by:: K Allred RN CONTRACTIONS: regular FHR: Fetal heart tracing reviewed. Cat 2     Labs: Lab Results  Component Value Date   WBC 8.9 05/18/2021   HGB 13.3 05/18/2021   HCT 38.6 05/18/2021   MCV 86.0 05/18/2021   PLT 252 05/18/2021    ASSESSMENT: 1) Labor curve reviewed.       Progress: Pt now in second stage     Active Problems:   Normal labor   PLAN:  Expect vaginal delivery  05/20/2021, M.D. 05/18/2021 4:59 AM

## 2021-05-18 NOTE — Progress Notes (Signed)
Rapid Response Team paged. Upon entering, patient was visibly agitated, swinging arms, muttering sounds, and not following commands after receiving Stadol. Heart rate was in the 90's, RR-25-27 bpm, SpO2 was 100%. Remained on standby until Physician arrived. Patient was given Narcan by RN. Patient's vital signs and neuro status returning to baseline.

## 2021-05-18 NOTE — H&P (Signed)
History and Physical   HPI  Nancy Brown is a 25 y.o. G2P0010 at [redacted]w[redacted]d Estimated Date of Delivery: 05/15/21 who is being admitted in active labor. She began having contractions at 9PM and upon presentation to L&D was contracting 2-4 minutes apart.  She was found to be 1 cm dilated.  She underwent a typical labor eval and 2 hours later was found to have rapidly changed to 5 cm dilation. She was admitted for labor management.   OB History  OB History  Gravida Para Term Preterm AB Living  2 0 0 0 1 0  SAB IAB Ectopic Multiple Live Births  0 0 0 0 0    # Outcome Date GA Lbr Len/2nd Weight Sex Delivery Anes PTL Lv  2 Current           1 AB             PROBLEM LIST  Pregnancy complications or risks: Patient Active Problem List   Diagnosis Date Noted   Normal labor 05/18/2021   Post-term pregnancy, 40-42 weeks of gestation 05/17/2021   Pelvic pressure in pregnancy, antepartum, third trimester 03/16/2021   Pregnancy 03/14/2021   COVID-19 affecting pregnancy, antepartum 10/07/2020   Supervision of normal first pregnancy 09/29/2020   False-positive serological test result 11/04/2015   ANA positive 10/21/2015   Hand discomfort 10/21/2015   Disruption of wound 12/16/2014    Prenatal labs and studies: ABO, Rh: --/--/O POS (07/27 0247) Antibody: NEG (07/27 0247) Rubella: 15.10 (01/26 1631) RPR: Non Reactive (05/05 0925)  HBsAg: Negative (01/26 1631)  HIV: Non Reactive (01/26 1631)  CZY:SAYTKZSW/-- (07/01 1104)   Past Medical History:  Diagnosis Date   Anxiety    w/ panic attacks   Hidradenitis axillaris    surgically removed in 2016.   History of COVID-19      Past Surgical History:  Procedure Laterality Date   AXILLARY HIDRADENITIS EXCISION Bilateral 12-04-14   CYST EXCISION Bilateral    side of ears     Medications    Current Discharge Medication List     CONTINUE these medications which have NOT CHANGED   Details  acetaminophen (TYLENOL) 500 MG  tablet Take 500-1,000 mg by mouth every 6 (six) hours as needed for mild pain or moderate pain.    levalbuterol (XOPENEX HFA) 45 MCG/ACT inhaler Inhale 1-2 puffs into the lungs every 4 (four) hours as needed for wheezing or shortness of breath. Qty: 1 each, Refills: 4    ondansetron (ZOFRAN) 4 MG tablet Take 1 tablet (4 mg total) by mouth every 8 (eight) hours as needed for nausea or vomiting. Qty: 20 tablet, Refills: 0    Prenatal Vit-Fe Fumarate-FA (PRENATAL VITAMIN PO) Take 1 tablet by mouth daily.         Allergies  Other, Mango flavor, and Yodora deodorant  Review of Systems  Pertinent items noted in HPI and remainder of comprehensive ROS otherwise negative.  Physical Exam  BP 117/80   Pulse 97   Temp 98.5 F (36.9 C) (Oral)   Resp 18   Ht 5\' 5"  (1.651 m)   Wt 81.6 kg   LMP 06/13/2020   SpO2 100%   BMI 29.95 kg/m   Lungs:  CTA B Cardio: RRR without M/R/G Abd: Soft, gravid, NT Presentation: cephalic EXT: No C/C/ 1+ Edema DTRs: 2+ B CERVIX: 5 cm 90%  See Prenatal records for more detailed PE.   FHR:  Good variability  Toco: Uterine Contractions: Q  2-4 min  Test Results  Results for orders placed or performed during the hospital encounter of 05/17/21 (from the past 24 hour(s))  CBC     Status: None   Collection Time: 05/18/21  2:47 AM  Result Value Ref Range   WBC 8.9 4.0 - 10.5 K/uL   RBC 4.49 3.87 - 5.11 MIL/uL   Hemoglobin 13.3 12.0 - 15.0 g/dL   HCT 37.1 69.6 - 78.9 %   MCV 86.0 80.0 - 100.0 fL   MCH 29.6 26.0 - 34.0 pg   MCHC 34.5 30.0 - 36.0 g/dL   RDW 38.1 01.7 - 51.0 %   Platelets 252 150 - 400 K/uL   nRBC 0.0 0.0 - 0.2 %  Type and screen [facility]     Status: None   Collection Time: 05/18/21  2:47 AM  Result Value Ref Range   ABO/RH(D) O POS    Antibody Screen NEG    Sample Expiration      05/21/2021,2359 Performed at [facility], [address] [place], [place] [address]   Glucose, capillary      Status: Abnormal   Collection Time: 05/18/21  3:09 AM  Result Value Ref Range   Glucose-Capillary 104 (H) 70 - 99 mg/dL     Assessment   [id number] at [redacted]w[redacted]d Estimated Date of Delivery: 05/15/21  The fetus is reassuring.   Patient Active Problem List   Diagnosis Date Noted   Normal labor 05/18/2021   Post-term pregnancy, 40-42 weeks of gestation 05/17/2021   Pelvic pressure in pregnancy, antepartum, third trimester 03/16/2021   Pregnancy 03/14/2021   COVID-19 affecting pregnancy, antepartum 10/07/2020   Supervision of normal first pregnancy 09/29/2020   False-positive serological test result 11/04/2015   ANA positive 10/21/2015   Hand discomfort 10/21/2015   Disruption of wound 12/16/2014    Plan  1. Admit to L&D :   2. EFM: -- Category 1 3. Stadol or Epidural if desired.   4. Admission labs  5. Expect vaginal delivery  Elonda Husky, M.D. 05/18/2021 4:40 AM

## 2021-05-18 NOTE — Anesthesia Preprocedure Evaluation (Signed)
Anesthesia Evaluation  Patient identified by MRN, date of birth, ID band Patient awake  General Assessment Comment:  Per OBGYN, patient had a strange reaction to Stadol, where she became delirious and babbling. Was given Narcan with resolution of symptoms.  Reviewed: Allergy & Precautions, NPO status , Patient's Chart, lab work & pertinent test results  History of Anesthesia Complications Negative for: history of anesthetic complications  Airway Mallampati: III  TM Distance: >3 FB Neck ROM: Full    Dental no notable dental hx. (+) Teeth Intact   Pulmonary neg pulmonary ROS, neg sleep apnea, neg COPD, Patient abstained from smoking.Not current smoker,    Pulmonary exam normal breath sounds clear to auscultation       Cardiovascular Exercise Tolerance: Good METS(-) hypertension(-) CAD and (-) Past MI negative cardio ROS  (-) dysrhythmias  Rhythm:Regular Rate:Normal - Systolic murmurs    Neuro/Psych PSYCHIATRIC DISORDERS Anxiety negative neurological ROS     GI/Hepatic neg GERD  ,(+)     (-) substance abuse  ,   Endo/Other  neg diabetes  Renal/GU negative Renal ROS     Musculoskeletal   Abdominal   Peds  Hematology   Anesthesia Other Findings Past Medical History: No date: Anxiety     Comment:  w/ panic attacks No date: Hidradenitis axillaris     Comment:  surgically removed in 2016. No date: History of COVID-19  Reproductive/Obstetrics (+) Pregnancy                             Anesthesia Physical Anesthesia Plan  ASA: 2  Anesthesia Plan: Epidural   Post-op Pain Management:    Induction:   PONV Risk Score and Plan: 3 and Treatment may vary due to age or medical condition and Ondansetron  Airway Management Planned: Natural Airway  Additional Equipment:   Intra-op Plan:   Post-operative Plan:   Informed Consent: I have reviewed the patients History and Physical, chart,  labs and discussed the procedure including the risks, benefits and alternatives for the proposed anesthesia with the patient or authorized representative who has indicated his/her understanding and acceptance.       Plan Discussed with: Surgeon  Anesthesia Plan Comments: (Patient 9.5cm dilated, in a lot of physical and emotional stress demanding epidural. I educated her on the possible low efficacy of the epidural at this stage and she fully accepts that.  Discussed R/B/A of neuraxial anesthesia technique with patient: - rare risks of spinal/epidural hematoma, nerve damage, infection - Risk of PDPH - Risk of itching - Risk of nausea and vomiting - Risk of poor block necessitating replacement of epidural. Patient voiced understanding.)        Anesthesia Quick Evaluation

## 2021-05-18 NOTE — Progress Notes (Signed)
Intrapartum Progress Note  I inherited this patient from Dr. Brennan Bailey.  I was updated on previous events in her labor course.   S: Patient noting pressure in her bottom.  States that she has been pushing for over 3 hours and is very tired. Has epidural in place.   O: Blood pressure 115/73, pulse 70, temperature 98.5 F (36.9 C), temperature source Oral, resp. rate 20, height 5\' 5"  (1.651 m), weight 81.6 kg, last menstrual period 06/13/2020, SpO2 98 %, unknown if currently breastfeeding. Gen App: NAD, lying on left side, uncomfortable Abdomen: soft, gravid FHT: baseline 155 bpm.  Accels present.  Decels present (deep recurrent variables from baseline to 90s).  Moderate in degree variability.   Tocometer: contractions q 1-3 minutes Cervix: 10/100/+2 Extremities: Nontender, no edema.  Pitocin: None  Labs: No new labs   Assessment:  1: SIUP at [redacted]w[redacted]d 2. Category II tracing 3. COVID-19 infection    Plan:  1. Discussed plan of care with patient. Due to concern for fetal well-being with variable decelerations present, would recommend an expedited delivery at this time, likely with a vacuum if no further descent noted with patient's likely exhaustion from labor events.  Counseled on risks/benefits.  2. Continue COVID precautions.  3. Anticipate delivery soon.    [redacted]w[redacted]d, MD 05/18/2021 1:46 PM

## 2021-05-18 NOTE — Significant Event (Signed)
Rapid Response Event Note   Reason for Call :  Change in mental status after administration of Butorphanol (STADOL).  Initial Focused Assessment:  Patient making incomprehensible  sounds and words. Patient flaying arms. Unable to redirect patient.   Patients Vital signs stable throughout rapid 0306 BP 136/ 76 HR 90s RR 27 O2 100 on RA 0325 BP 142/78 HR 95 RR 27  O2 100 on RA  Interventions:  Narcan given - Neuro symptoms completely resolved. Patient Alert and oriented complain of labor pain.    Plan of Care:  Monitor  Neuro status and Pain management   Event Summary:   0500 Patients vital signs stable and neuro statis within normal limits  MD Notified: Logan Bores MD Call Time: 0302 Arrival Time:0306 End Time:0330  Judyann Munson, RN

## 2021-05-18 NOTE — Progress Notes (Signed)
RN at bedside after given IV stadol for pain management in labor. Patient began to show symptoms of a change in mental status. SCC at bedside. Evans MD called to come assess patient at beside. AC and Rapid Response Team called at 0302. AC and Rapid Response arrived at bedside at 0306. Evans MD at bedside at [phone]. Please see Rapid Response Team note for further information.

## 2021-05-18 NOTE — Progress Notes (Signed)
Ministry of presence, prayer for patient, baby, and family present.

## 2021-05-18 NOTE — Anesthesia Procedure Notes (Signed)
Epidural Patient location during procedure: OB Start time: 05/18/2021 3:44 AM End time: 05/18/2021 4:03 AM  Staffing Anesthesiologist: Corinda Gubler, MD Performed: anesthesiologist   Preanesthetic Checklist Completed: patient identified, IV checked, site marked, risks and benefits discussed, surgical consent, monitors and equipment checked, pre-op evaluation and timeout performed  Epidural Patient position: sitting Prep: ChloraPrep Patient monitoring: heart rate, continuous pulse ox and blood pressure Approach: midline Location: L3-L4 Injection technique: LOR saline  Needle:  Needle type: Tuohy  Needle gauge: 17 G Needle length: 9 cm and 9 Needle insertion depth: 6 cm Catheter type: closed end flexible Catheter size: 19 Gauge Catheter at skin depth: 11 cm Test dose: negative and 2% lidocaine with Epi 1:200 K  Assessment Sensory level: T10 Events: blood not aspirated, injection not painful, no injection resistance, no paresthesia and negative IV test  Additional Notes COMBINED SPINAL EPIDURAL. first attempt Pt. Evaluated and documentation done after procedure finished. Patient identified. Risks/Benefits/Options discussed with patient including but not limited to bleeding, infection, nerve damage, paralysis, failed block, incomplete pain control, headache, blood pressure changes, nausea, vomiting, reactions to medication both or allergic, itching and postpartum back pain. Confirmed with bedside nurse the patient's most recent platelet count. Confirmed with patient that they are not currently taking any anticoagulation, have any bleeding history or any family history of bleeding disorders. Patient expressed understanding and wished to proceed. All questions were answered.  Sterile technique was used throughout the entire procedure. Please see nursing notes for vital signs. Local anesthetic skin weal injected, loss of resistance to saline obtained, no evident dural puncture CSF or  blood. Then, a 25g spinal needle was passed through the epidural, with clear CSF return. Medication given intrathecally as documented. Spinal needle was removed, and then epidural catheter threaded.Test dose was given through epidural catheter and negative prior to continuing to dose epidural or start infusion. Warning signs of high block given to the patient including shortness of breath, tingling/numbness in hands, complete motor block, or any concerning symptoms with instructions to call for help. Patient was given instructions on fall risk and not to get out of bed. All questions and concerns addressed with instructions to call with any issues or inadequate analgesia.    Patient tolerated the insertion well without immediate complications. Reason for block:procedure for pain

## 2021-05-19 LAB — CBC
HCT: 35.1 % — ABNORMAL LOW (ref 36.0–46.0)
Hemoglobin: 12 g/dL (ref 12.0–15.0)
MCH: 30.4 pg (ref 26.0–34.0)
MCHC: 34.2 g/dL (ref 30.0–36.0)
MCV: 88.9 fL (ref 80.0–100.0)
Platelets: 225 10*3/uL (ref 150–400)
RBC: 3.95 MIL/uL (ref 3.87–5.11)
RDW: 14.3 % (ref 11.5–15.5)
WBC: 7.2 10*3/uL (ref 4.0–10.5)
nRBC: 0 % (ref 0.0–0.2)

## 2021-05-19 MED ORDER — IBUPROFEN 600 MG PO TABS: 600.0000 mg | ORAL_TABLET | Freq: Four times a day (QID) | ORAL | 0 refills | Status: DC

## 2021-05-19 NOTE — Anesthesia Postprocedure Evaluation (Signed)
Anesthesia Post Note  Patient: Nancy Brown  Procedure(s) Performed: AN AD HOC LABOR EPIDURAL  Patient location during evaluation: Mother Baby Anesthesia Type: Epidural Level of consciousness: awake and alert Pain management: pain level controlled Vital Signs Assessment: post-procedure vital signs reviewed and stable Respiratory status: spontaneous breathing, nonlabored ventilation and respiratory function stable Cardiovascular status: stable Postop Assessment: no headache, no backache and epidural receding Anesthetic complications: no   No notable events documented.   Last Vitals:  Vitals:   05/19/21 0115 05/19/21 0426  BP: 114/67   Pulse: 66   Resp: 16   Temp: (!) 36.4 C 36.6 C  SpO2: 99%     Last Pain:  Vitals:   05/19/21 0426  TempSrc: Oral  PainSc: 0-No pain                 Quang Thorpe,  Alessandra Bevels

## 2021-05-19 NOTE — Discharge Summary (Signed)
Postpartum Discharge Summary       Patient Name: Nancy Brown DOB: 03/14/1996 MRN: [id number]  Date of admission: 05/17/2021 Delivery date:05/18/2021  Delivering provider: Rubie Maid  Date of discharge: 05/19/2021  Admitting diagnosis: Normal labor [O80, Z37.9] Intrauterine pregnancy: [redacted]w[redacted]d    Secondary diagnosis:  Active Problems:  Post-term pregnancy 40-[redacted] weeks gestation  COVID-19 infection in third trimester  Additional problems:   Drug reaction resulting in brief psychotic states ([facility]   Discharge diagnosis: Term Pregnancy Delivered                                              Post partum procedures: None Augmentation: AROM Complications: None  Hospital course: Onset of Labor With Vaginal Delivery      25y.o. yo G2P1011 at 424w3das admitted in Active Labor on 05/17/2021. She as also recently diagnosed with COVID-19 with mild respiratory symptoms.  Patient had a complicated labor course as follows:  Membrane Rupture Time/Date: 4:19 AM ,05/18/2021   Delivery Method:Vaginal, Spontaneous  Episiotomy: None  Lacerations:  None  She received a dose of IV Stadol which induced a brief psychosis. Rapid response was called. Her psychosis was reversed with a dose of Narcan.  Patient had an uncomplicated postpartum course.  She is ambulating, tolerating a regular diet, passing flatus, and urinating well. Patient is discharged home in stable condition on 05/23/21.  Newborn Data: Birth date:05/18/2021  Birth time:8:16 AM  Gender:Female  Living status:Living  Apgars:2 ,6  Weight:2580 g   Magnesium Sulfate received: No BMZ received: No Rhophylac:No MMR:No T-DaP:Given prenatally Flu: No Transfusion:No  Physical exam  Vitals:   05/18/21 2110 05/19/21 0115 05/19/21 0426 05/19/21 0856  BP: 116/67 114/67  118/69  Pulse: 79 66  76  Resp: _0 Temp: 97.9 F (36.6 C) (!) 97.5 F (36.4 C) 97.9 F (36.6 C) 97.7 F (36.5 C)  TempSrc: Oral Oral Oral Oral  SpO2: 97% 99%   98%  Weight:      Height:       General: alert, cooperative, and no distress Lochia: appropriate Uterine Fundus: firm Incision: N/A DVT Evaluation: No evidence of DVT seen on physical exam.  Negative Homan's sign.  No cords or calf tenderness. No significant calf/ankle edema.   Labs: Lab Results  Component Value Date   WBC 7.2 05/19/2021   HGB 12.0 05/19/2021   HCT 35.1 (L) 05/19/2021   MCV 88.9 05/19/2021   PLT 225 05/19/2021   CMP Latest Ref Rng & Units 01/17/2021  Glucose 70 - 99 mg/dL 73  BUN 6 - 20 mg/dL 8  Creatinine 0.44 - 1.00 mg/dL 0.48  Sodium 135 - 145 mmol/L 134(L)  Potassium 3.5 - 5.1 mmol/L 3.3(L)  Chloride 98 - 111 mmol/L 105  CO2 22 - 32 mmol/L 22  Calcium 8.9 - 10.3 mg/dL 9.0  Total Protein 6.5 - 8.1 g/dL -  Total Bilirubin 0.3 - 1.2 mg/dL -  Alkaline Phos 38 - 126 U/L -  AST 15 - 41 U/L -  ALT 0 - 44 U/L -   Edinburgh Score: Edinburgh Postnatal Depression Scale Screening Tool 05/18/2021  I have been able to laugh and see the funny side of things. 0  I have looked forward with enjoyment to things. 0  I have blamed myself unnecessarily when things went wrong. 0  I have been anxious or worried for no good reason. 0  I have felt scared or panicky for no good reason. 0  Things have been getting on top of me. 0  I have been so unhappy that I have had difficulty sleeping. 0  I have felt sad or miserable. 0  I have been so unhappy that I have been crying. 0  The thought of harming myself has occurred to me. 0  Edinburgh Postnatal Depression Scale Total 0      After visit meds:  Allergies as of 05/19/2021       Reactions   Other Anaphylaxis, Itching   Pineapples   Stadol [butorphanol] Other (See Comments)   Change of mental status   Mango Flavor Itching   Mangos   Secretary  Had to get sweat glands removed        Medication List     STOP taking these medications    acetaminophen 500 MG tablet Commonly known  as: TYLENOL   ondansetron 4 MG tablet Commonly known as: ZOFRAN       TAKE these medications    ibuprofen 600 MG tablet Commonly known as: ADVIL Take 1 tablet (600 mg total) by mouth every 6 (six) hours.   levalbuterol 45 MCG/ACT inhaler Commonly known as: XOPENEX HFA Inhale 1-2 puffs into the lungs every 4 (four) hours as needed for wheezing or shortness of breath.   PRENATAL VITAMIN PO Take 1 tablet by mouth daily.         Discharge home in stable condition Infant Feeding: Bottle and Breast Infant Disposition:home with mother Discharge instruction: per After Visit Summary and Postpartum booklet. Activity: Advance as tolerated. Pelvic rest for 6 weeks.  Diet: routine diet Anticipated Birth Control: Unsure Postpartum Appointment:6 weeks Additional Postpartum F/U: Postpartum Depression checkup in 2-3 weeks Future Appointments:No future appointments. Follow up Visit:  Follow-up Information     Rubie Maid, MD Follow up.   Specialties: Obstetrics and Gynecology, Radiology Why: Video visit fo 2-3 week postpartm mood check 6 weeks for postpartum visit (in person) Contact information: [address] [place] [id number] [phone]                     05/19/2021 Rubie Maid, MD

## 2021-05-19 NOTE — Progress Notes (Addendum)
Pt discharged with infant.  Discharge instructions, prescriptions and follow up appointment given to and reviewed with pt. Pt verbalized understanding. Escorted out by staff. 

## 2021-05-19 NOTE — Progress Notes (Signed)
Post Partum Day # 1, s/p SVD  Subjective: no complaints, up ad lib, voiding, and tolerating PO. Is breast and formula feeding, going well.   Objective: Temp:  [97.5 F (36.4 C)-98.5 F (36.9 C)] 97.7 F (36.5 C) (07/28 0856) Pulse Rate:  [66-79] 76 (07/28 0856) Resp:  [16-20] 18 (07/28 0856) BP: (114-118)/(67-73) 118/69 (07/28 0856) SpO2:  [97 %-99 %] 98 % (07/28 0856)  Physical Exam:  General: alert and no distress  Lungs: clear to auscultation bilaterally Breasts: normal appearance, no masses or tenderness Heart: regular rate and rhythm, S1, S2 normal, no murmur, click, rub or gallop Abdomen: soft, non-tender; bowel sounds normal; no masses,  no organomegaly Pelvis: Lochia: appropriate, Uterine Fundus: firm Extremities: DVT Evaluation: No evidence of DVT seen on physical exam. Negative Homan's sign. No cords or calf tenderness. No significant calf/ankle edema.  Recent Labs    05/18/21 0247 05/19/21 0414  HGB 13.3 12.0  HCT 38.6 35.1*    Assessment/Plan: Doing well postpartum Breastfeeding, Lactation consult,  Circumcision prior to discharge Contraception hormonal patch Dispo: patient desires to go home today. Can d/c home if baby is cleared by Pediatrician.    LOS: 1 day   Hildred Laser, MD [facility]

## 2021-05-25 ENCOUNTER — Other Ambulatory Visit: Payer: 59

## 2021-05-25 ENCOUNTER — Encounter: Payer: 59 | Admitting: Obstetrics and Gynecology

## 2021-06-09 ENCOUNTER — Ambulatory Visit (INDEPENDENT_AMBULATORY_CARE_PROVIDER_SITE_OTHER): Payer: 59 | Admitting: Obstetrics and Gynecology

## 2021-06-09 ENCOUNTER — Encounter: Payer: Self-pay | Admitting: Obstetrics and Gynecology

## 2021-06-09 ENCOUNTER — Other Ambulatory Visit: Payer: Self-pay

## 2021-06-09 VITALS — Ht 65.0 in

## 2021-06-09 DIAGNOSIS — O99345 Other mental disorders complicating the puerperium: Secondary | ICD-10-CM

## 2021-06-09 DIAGNOSIS — F53 Postpartum depression: Secondary | ICD-10-CM

## 2021-06-09 MED ORDER — SERTRALINE HCL 50 MG PO TABS: 50.0000 mg | ORAL_TABLET | Freq: Every day | ORAL | 1 refills | Status: DC

## 2021-06-09 NOTE — Progress Notes (Signed)
Virtual Visit via Telephone Note  I connected with Nancy Brown on 06/09/21 at 11:00 AM EDT by telephone and verified that I am speaking with the correct person using two identifiers.  Location: Patient: Home Provider: Office   I discussed the limitations, risks, security and privacy concerns of performing an evaluation and management service by telephone and the availability of in person appointments. I also discussed with the patient that there may be a patient responsible charge related to this service. The patient expressed understanding and agreed to proceed.   History of Present Illness: Nancy Brown is a 25 y.o. G79P1011 female who presents for 2 week postpartum check.  She is s/p NSVD at term  (40.3 weeks). She is currently breastfeeding and pumping.  Breastfeeding is going well. Nancy Brown reports that she has been very tearful over the past few weeks. Also exhibiting some anxiety whenever anyone else holds or touches her baby. Thinks that she may have postpartum depression.  Denies SI/HI. EDPS screen is positive, score is 12. Has good support at home.   Desires contraceptive patch for birth control.    Observations/Objective: Height 5\' 5"  (1.651 m), currently breastfeeding. Gen App: patient sounds overall well, no acute distress Psych: normal affect, depressed mood.    Edinburgh Postnatal Depression Scale Screening Tool 06/09/2021 05/18/2021  I have been able to laugh and see the funny side of things. 0 0  I have looked forward with enjoyment to things. 2 0  I have blamed myself unnecessarily when things went wrong. 0 0  I have been anxious or worried for no good reason. 2 0  I have felt scared or panicky for no good reason. 2 0  Things have been getting on top of me. 3 0  I have been so unhappy that I have had difficulty sleeping. 2 0  I have felt sad or miserable. 0 0  I have been so unhappy that I have been crying. 1 0  The thought of harming myself has occurred to me. 0 0   Edinburgh Postnatal Depression Scale Total 12 0      Assessment and Plan:  1. Postpartum state Patient s/p vaginal delivery. Labor course was eventful, however postpartum course has been well except issues with her mood.   2. Vaginal delivery - Notes overall healing well, has questions about normal expectations of her body after vaginal delivery. Questions answered.  3. Postpartum depression Discussion had with patient today regarding symptoms.  Likely suffering from postpartum depression.  Currently no SI/HI.  Edinburgh screening is positive. Patient notes that she has good support at home.  Advised on getting as much rest as possible and utilizing her support system as sleep deprivation can also intensify symptoms. Discussed options for management, including counseling and/or medications, or both.  Patient desires medication for now.  Will prescribe Zoloft 50 mg. To follow up in 4 weeks with postpartum visit.    Follow Up Instructions: Follow up in 4 weeks for final postpartum visit and postpartum depression surveillance.    I discussed the assessment and treatment plan with the patient. The patient was provided an opportunity to ask questions and all were answered. The patient agreed with the plan and demonstrated an understanding of the instructions.   The patient was advised to call back or seek an in-person evaluation if the symptoms worsen or if the condition fails to improve as anticipated.  I provided 12 minutes of non-face-to-face time during this encounter.   05/20/2021, MD [facility]

## 2021-06-09 NOTE — Progress Notes (Signed)
Televisit-  2 week ppv. Pt stated that she was doing well. EPDS-12.

## 2021-07-07 NOTE — Progress Notes (Deleted)
   OBSTETRICS POSTPARTUM CLINIC PROGRESS NOTE  Subjective:     Nancy Brown is a 25 y.o. G11P1011 female who presents for a postpartum visit. She is 4 week postpartum following a spontaneous vaginal delivery. I have fully reviewed the prenatal and intrapartum course. The delivery was at 40.3 gestational weeks.  Anesthesia: none. Postpartum course has been ***. Baby's course has been ***. Baby is feeding by breast. Bleeding: patient {HAS HAS ELF:81017} not resumed menses, with No LMP recorded.. Bowel function is {normal:32111}. Bladder function is {normal:32111}. Patient {is/is not:9024} sexually active. Contraception method desired is patch. Postpartum depression screening: {neg default:13464::"negative"}.  EDPS score is ***.    The following portions of the patient's history were reviewed and updated as appropriate: allergies, current medications, past family history, past medical history, past social history, past surgical history, and problem list.  Review of Systems {ros; complete:30496}   Objective:    There were no vitals taken for this visit.  General:  alert and no distress   Breasts:  inspection negative, no nipple discharge or bleeding, no masses or nodularity palpable  Lungs: clear to auscultation bilaterally  Heart:  regular rate and rhythm, S1, S2 normal, no murmur, click, rub or gallop  Abdomen: soft, non-tender; bowel sounds normal; no masses,  no organomegaly.  Well healed Pfannenstiel incision   Vulva:  normal  Vagina: normal vagina, no discharge, exudate, lesion, or erythema  Cervix:  no cervical motion tenderness and no lesions  Corpus: normal size, contour, position, consistency, mobility, non-tender  Adnexa:  normal adnexa and no mass, fullness, tenderness  Rectal Exam: Not performed.         Labs:  Lab Results  Component Value Date   HGB 12.0 05/19/2021     Assessment:   No diagnosis found.   Plan:    1. Contraception:  patch 2. Will check Hgb for h/o  postpartum anemia of less than 10.  3. Follow up in: {1-10:13787} {time; units:19136} or as needed.    Hildred Laser, MD [facility]

## 2021-07-08 ENCOUNTER — Other Ambulatory Visit: Payer: Self-pay

## 2021-07-08 ENCOUNTER — Encounter: Payer: 59 | Admitting: Obstetrics and Gynecology

## 2021-07-14 ENCOUNTER — Other Ambulatory Visit: Payer: Self-pay

## 2021-07-14 MED ORDER — SERTRALINE HCL 50 MG PO TABS: 50.0000 mg | ORAL_TABLET | Freq: Every day | ORAL | 0 refills | Status: DC

## 2021-07-18 NOTE — Progress Notes (Signed)
   OBSTETRICS POSTPARTUM CLINIC PROGRESS NOTE  Subjective:     Nancy Brown is a 25 y.o. G26P1011 female who presents for a postpartum visit. She is 6 weeks postpartum following a spontaneous vaginal delivery. I have fully reviewed the prenatal and intrapartum course. The delivery was at [redacted]w[redacted]d gestational weeks.  Anesthesia: epidural. Postpartum course has been well. Baby's course has been well. Baby is feeding by breast (pumping) and formula - Gerber. Bleeding: patient has not resumed menses. Bowel function is normal. Bladder function is normal. Patient is not sexually active. Contraception method desired is [employer] weekly. Postpartum depression screening: negative.  EDPS score is 6. Doing well on Zoloft. Patient declined flu vaccine.    Edinburgh Postnatal Depression Scale Screening Tool 07/19/2021 06/09/2021 05/18/2021  I have been able to laugh and see the funny side of things. 0 0 0  I have looked forward with enjoyment to things. 1 2 0  I have blamed myself unnecessarily when things went wrong. 0 0 0  I have been anxious or worried for no good reason. 2 2 0  I have felt scared or panicky for no good reason. 0 2 0  Things have been getting on top of me. 2 3 0  I have been so unhappy that I have had difficulty sleeping. 0 2 0  I have felt sad or miserable. 1 0 0  I have been so unhappy that I have been crying. 0 1 0  The thought of harming myself has occurred to me. 0 0 0  Edinburgh Postnatal Depression Scale Total 6 12 0      The following portions of the patient's history were reviewed and updated as appropriate: allergies, current medications, past family history, past medical history, past social history, past surgical history, and problem list.  Review of Systems Pertinent items noted in HPI and remainder of comprehensive ROS otherwise negative.   Objective:    BP 130/76 (BP Location: Left Arm, Patient Position: Sitting, Cuff Size: Normal)   Pulse 84   Ht 5\' 5"  (1.651 m)    Wt 152 lb 3.2 oz (69 kg)   LMP  (LMP Unknown)   Breastfeeding No   BMI 25.33 kg/m   General:  alert and no distress   Breasts:  inspection negative, no nipple discharge or bleeding, no masses or nodularity palpable  Lungs: clear to auscultation bilaterally  Heart:  regular rate and rhythm, S1, S2 normal, no murmur, click, rub or gallop  Abdomen: soft, non-tender; bowel sounds normal; no masses,  no organomegaly.     Vulva:  normal  Vagina: normal vagina, no discharge, exudate, lesion, or erythema  Cervix:  no cervical motion tenderness and no lesions  Corpus: normal size, contour, position, consistency, mobility, non-tender  Adnexa:  normal adnexa and no mass, fullness, tenderness  Rectal Exam: Not performed.         Labs:  Lab Results  Component Value Date   HGB 12.0 05/19/2021     Assessment:   1. Postpartum care following vaginal delivery   2. Postpartum depression   3. Encounter for initial prescription of transdermal patch hormonal contraceptive device      Plan:   1. Contraception: Xulane patches weekly. Advised to begin on Sunday. Currently not sexually active. 2. Follow up in: 3-6 months for annual exam 3. Zoloft prescription refilled, notes she is doing better with medication.    Monday, MD [facility]

## 2021-07-19 ENCOUNTER — Other Ambulatory Visit: Payer: Self-pay

## 2021-07-19 ENCOUNTER — Encounter: Payer: Self-pay | Admitting: Obstetrics and Gynecology

## 2021-07-19 ENCOUNTER — Ambulatory Visit (INDEPENDENT_AMBULATORY_CARE_PROVIDER_SITE_OTHER): Payer: 59 | Admitting: Obstetrics and Gynecology

## 2021-07-19 DIAGNOSIS — Z30016 Encounter for initial prescription of transdermal patch hormonal contraceptive device: Secondary | ICD-10-CM

## 2021-07-19 DIAGNOSIS — O99345 Other mental disorders complicating the puerperium: Secondary | ICD-10-CM | POA: Diagnosis not present

## 2021-07-19 DIAGNOSIS — F53 Postpartum depression: Secondary | ICD-10-CM | POA: Diagnosis not present

## 2021-07-19 LAB — POCT URINE PREGNANCY: Preg Test, Ur: NEGATIVE

## 2021-07-19 MED ORDER — XULANE 150-35 MCG/24HR TD PTWK: 1.0000 | MEDICATED_PATCH | TRANSDERMAL | 3 refills | Status: AC

## 2021-07-19 MED ORDER — SERTRALINE HCL 50 MG PO TABS: 50.0000 mg | ORAL_TABLET | Freq: Every day | ORAL | 5 refills | Status: DC

## 2021-07-19 MED ORDER — SERTRALINE HCL 50 MG PO TABS: 50.0000 mg | ORAL_TABLET | Freq: Every day | ORAL | 3 refills | Status: AC

## 2021-07-19 NOTE — Patient Instructions (Signed)

## 2021-11-03 ENCOUNTER — Encounter: Payer: Self-pay | Admitting: Obstetrics and Gynecology

## 2022-01-24 IMAGING — US US OB COMP +14 WK
1 series · 13 of 28 positions shown · non-contrast
Comparison: none

CLINICAL DATA: Pregnancy.  Evaluate fetal anatomy

EXAM:
OBSTETRICAL ULTRASOUND >14 WKS

[Series 1: us ob comp +14 wk · 0.23mm/px · 13 of 89 slices shown]
[im 4/89]
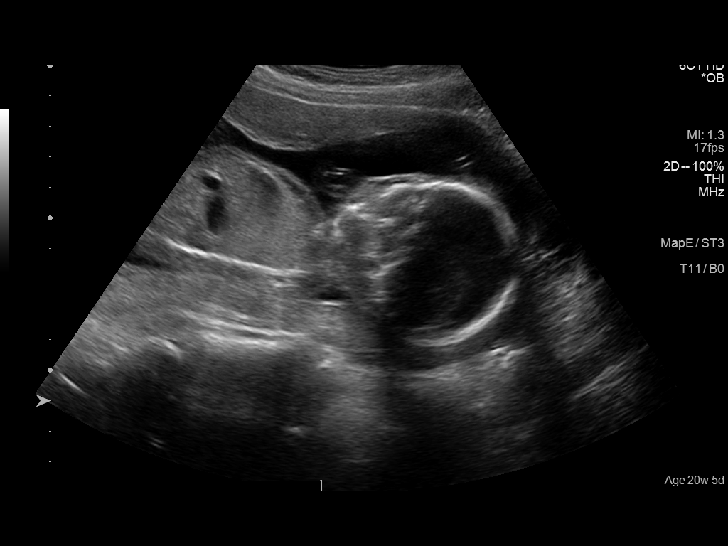
[im 10/89]
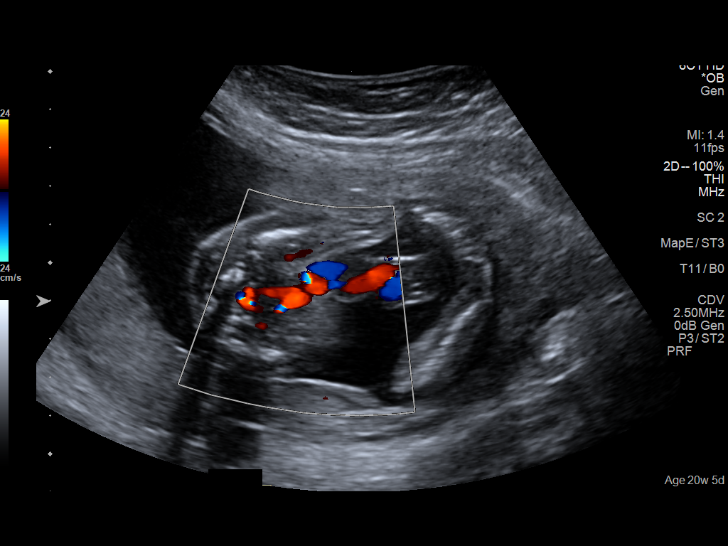
[im 17/89]
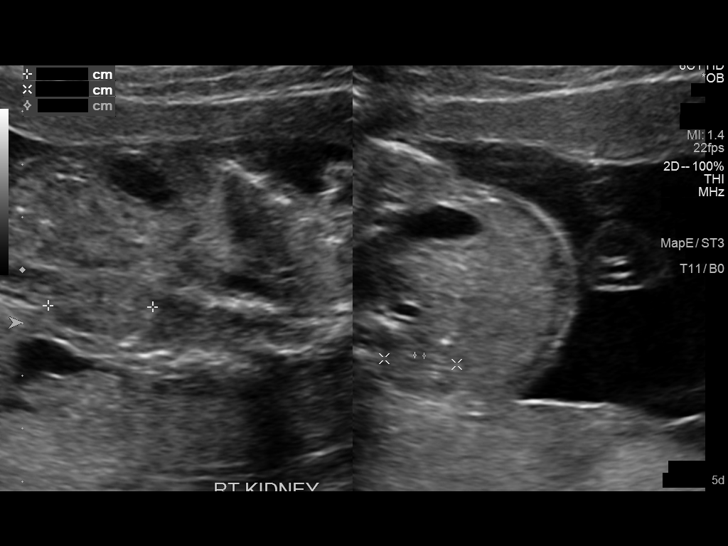
[im 23/89]
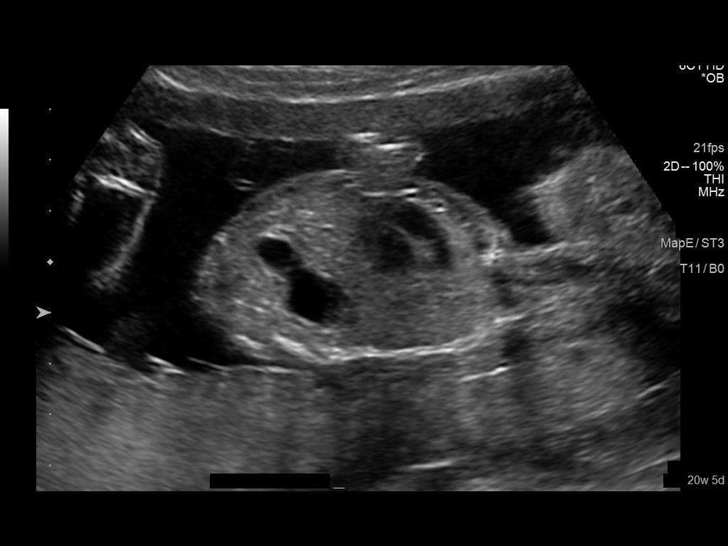
[im 30/89]
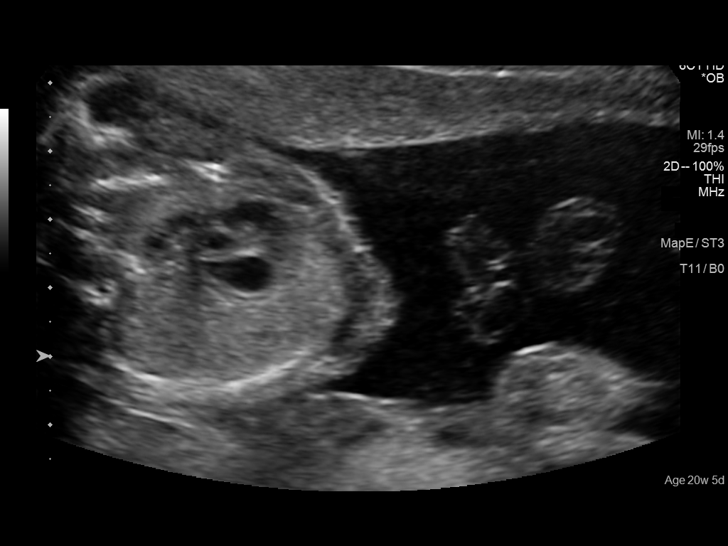
[im 36/89]
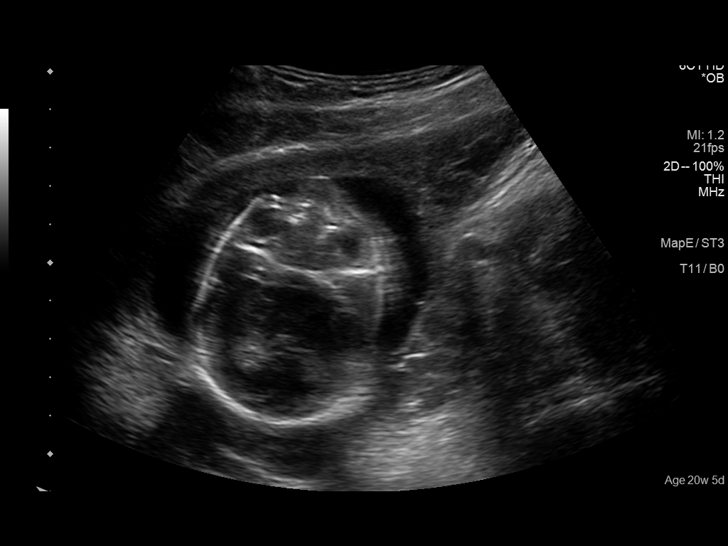
[im 46/89]
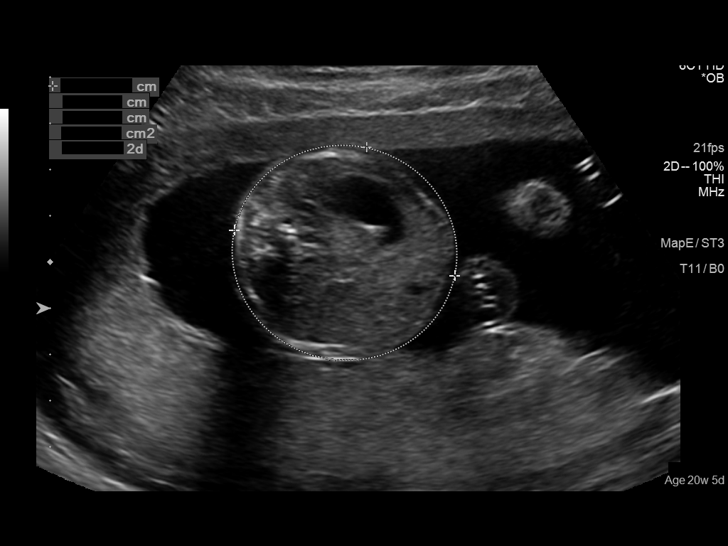
[im 53/89]
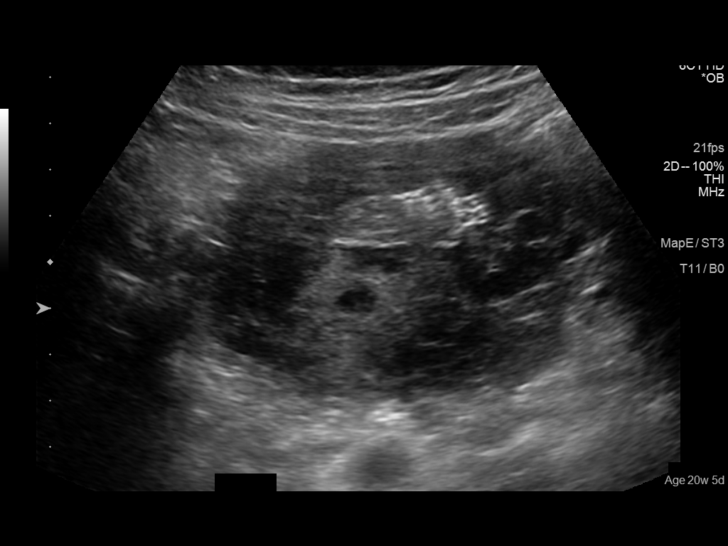
[im 59/89]
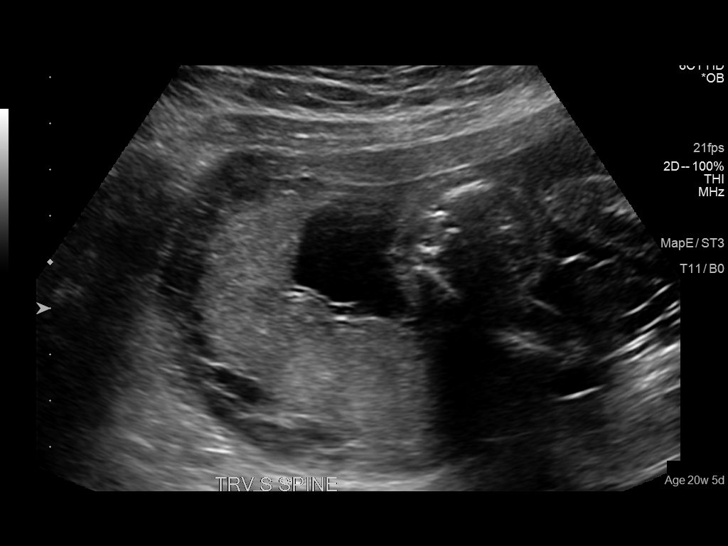
[im 66/89]
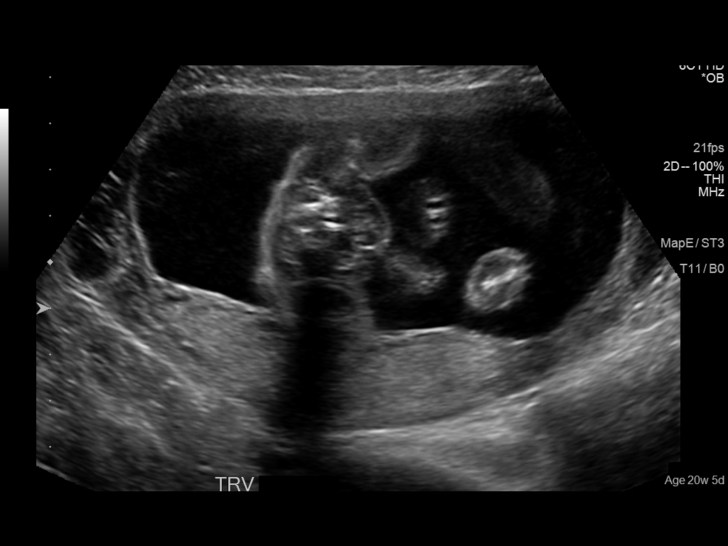
[im 72/89]
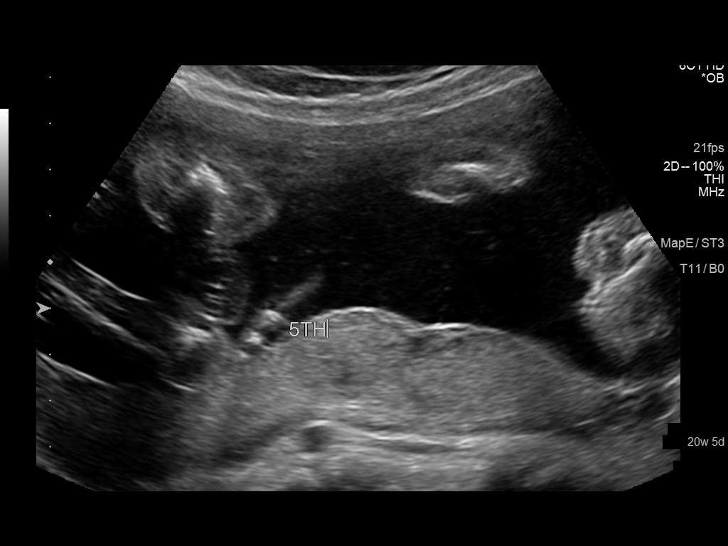
[im 79/89]
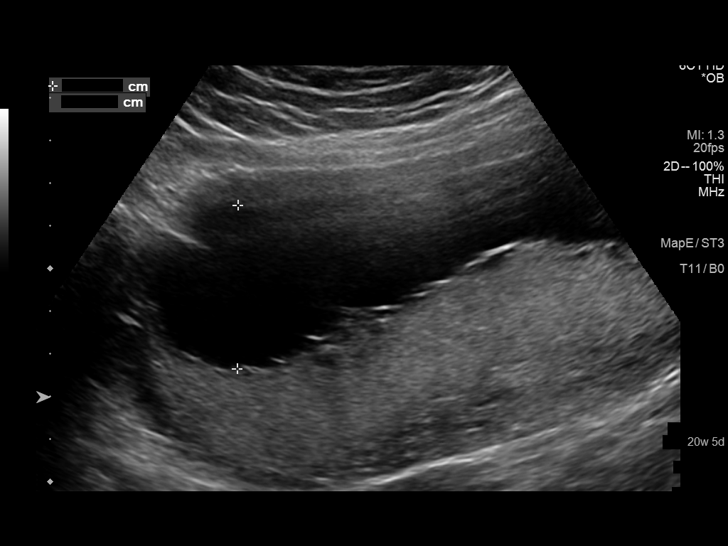
[im 85/89]
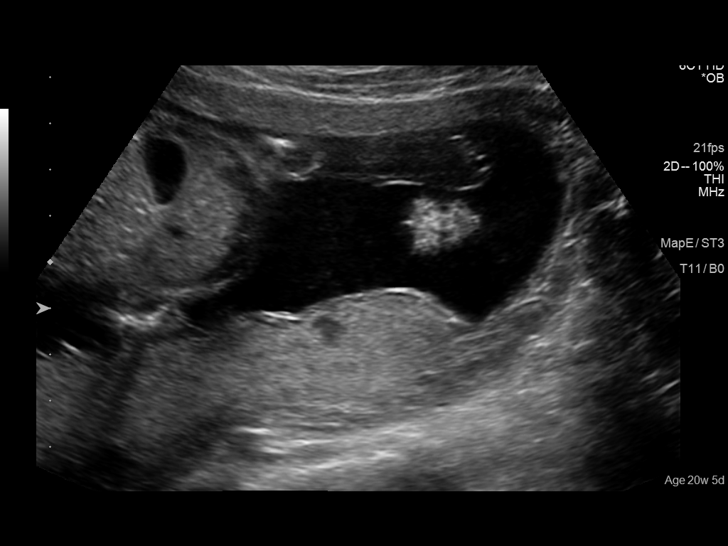

[13 of 28 positions shown; findings below may reference images not displayed]

FINDINGS: Number of Fetuses: 1

Heart Rate:  125 bpm

Movement: Yes

Presentation: Cephalic

Previa: No

Placental Location: Posterior

Amniotic Fluid (Subjective): Normal

Amniotic Fluid (Objective):

Vertical pocket = 3.8cm

FETAL BIOMETRY

BPD: 4.6cm 20w 0d

HC:   17.7cm 20w 1d

AC:   15.1cm 20w 2d

FL:   3.4cm 20w 5d

Current Mean GA: 20w 3d US EDC: 05/17/2021

Assigned GA:  20w 5d Assigned EDC: 05/15/2021

Estimated Fetal Weight:  352g 29%ile

FETAL ANATOMY

Lateral Ventricles: Appears normal

Thalami/CSP: Appears normal

Posterior Fossa:  Appears normal

Nuchal Region: Appears normal   NFT= N/A > 20 WKS

Upper Lip: Appears normal

Spine: Appears normal

4 Chamber Heart on Left: Appears normal

LVOT: Appears normal

RVOT: Appears normal

Stomach on Left: Appears normal

3 Vessel Cord: Appears normal

Cord Insertion site: Appears normal

Kidneys: Appears normal

Bladder: Appears normal

Extremities: Appears normal

Sex: Male

Technically difficult due to: None.

Maternal Findings:

Cervix:  Closed.  4.5 cm.
IMPRESSION: 1. Single live intrauterine gestation in cephalic presentation with
assigned gestational age of 20 weeks 5 days.
2. Complete fetal anatomic survey, as above. No fetal anomaly
identified.
3. Estimated fetal weight is in the 29th percentile.

## 2023-11-19 ENCOUNTER — Ambulatory Visit
Admission: EM | Admit: 2023-11-19 | Discharge: 2023-11-19 | Disposition: A | Discharge status: Home / Self Care | Payer: Self-pay | Attending: Physician Assistant | Admitting: Physician Assistant

## 2023-11-19 DIAGNOSIS — R197 Diarrhea, unspecified: Secondary | ICD-10-CM

## 2023-11-19 DIAGNOSIS — A084 Viral intestinal infection, unspecified: Secondary | ICD-10-CM

## 2023-11-19 DIAGNOSIS — R112 Nausea with vomiting, unspecified: Secondary | ICD-10-CM

## 2023-11-19 MED ORDER — ONDANSETRON 4 MG PO TBDP: 4.0000 mg | ORAL_TABLET | Freq: Four times a day (QID) | ORAL | 0 refills | Status: AC | PRN

## 2023-11-19 NOTE — ED Triage Notes (Addendum)
Sx since last night  Emesis and diarrhea  Last episode this morning at 7 am.  No fever Back and leg pain

## 2023-11-19 NOTE — ED Provider Notes (Signed)
[facility]    CSN: [id number] Arrival date & time: 11/19/23  0843      History   Chief Complaint Chief Complaint  Patient presents with   Emesis   Diarrhea   Back Pain    HPI Nancy Brown is a 28 y.o. female having for onset of abdominal cramping, nausea/vomiting and diarrhea that began last night.  Has had a couple episodes of diarrhea and several episodes of vomiting this morning.  Also reports generalized achiness and fatigue with loss of appetite.  No fever, cough, congestion, sore throat, dysuria, urgency, flank pain.  Diarrhea is watery and green.  No sick contacts.  No OTC meds taken.  No other complaints.  HPI  Past Medical History:  Diagnosis Date   Anxiety    w/ panic attacks   Hidradenitis axillaris    surgically removed in 2016.   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   Post-term pregnancy, 40-42 weeks of gestation 05/17/2021   Pelvic pressure in pregnancy, antepartum, third trimester 03/16/2021   Pregnancy 03/14/2021   COVID-19 affecting pregnancy, antepartum 10/07/2020   Supervision of normal first pregnancy 09/29/2020   False-positive serological test result 11/04/2015   ANA positive 10/21/2015   Hand discomfort 10/21/2015   Disruption of wound 12/16/2014    Past Surgical History:  Procedure Laterality Date   AXILLARY HIDRADENITIS EXCISION Bilateral 12-04-14   CYST EXCISION Bilateral    side of ears    OB History     Gravida  2   Para  1   Term  1   Preterm      AB  1   Living  1      SAB      IAB      Ectopic      Multiple  0   Live Births  1            Home Medications    Prior to Admission medications   Medication Sig Start Date End Date Taking? Authorizing Provider  ondansetron (ZOFRAN-ODT) 4 MG disintegrating tablet Take 1 tablet (4 mg total) by mouth every 6 (six)  hours as needed for nausea or vomiting. 11/19/23  Yes Eusebio Friendly B, PA-C  ibuprofen (ADVIL) 600 MG tablet Take 1 tablet (600 mg total) by mouth every 6 (six) hours. 05/19/21   Hildred Laser, MD  levalbuterol [facility] HFA) 45 MCG/ACT inhaler Inhale 1-2 puffs into the lungs every 4 (four) hours as needed for wheezing or shortness of breath. 03/24/21   Hildred Laser, MD  norelgestromin-ethinyl estradiol [facility]) 150-35 MCG/24HR transdermal patch Place 1 patch onto the skin once a week. 07/19/21   Hildred Laser, MD  Prenatal Vit-Fe Fumarate-FA (PRENATAL VITAMIN PO) Take 1 tablet by mouth daily.    [provider]  sertraline (ZOLOFT) 50 MG tablet Take 1 tablet (50 mg total) by mouth daily. 07/19/21   Hildred Laser, MD    Family History Family History  Problem Relation Age of Onset   Hypertension Mother    Asthma Mother    Thyroid disease Mother    Hyperlipidemia Mother    Other Father        "gum disease"    Social History Social History   Tobacco Use   Smoking status: Never   Smokeless tobacco: Never  Vaping Use   Vaping status:  Never Used  Substance Use Topics   Alcohol use: No   Drug use: No     Allergies   Other, Stadol [butorphanol], Mango flavoring agent (non-screening), and Yodora deodorant   Review of Systems Review of Systems  Constitutional:  Positive for appetite change and fatigue. Negative for chills, diaphoresis and fever.  HENT:  Negative for congestion, ear pain, rhinorrhea and sore throat.   Respiratory:  Negative for cough and shortness of breath.   Cardiovascular:  Negative for chest pain.  Gastrointestinal:  Positive for abdominal pain, diarrhea, nausea and vomiting.  Genitourinary:  Negative for dysuria and flank pain.  Musculoskeletal:  Positive for myalgias.  Skin:  Negative for rash.  Neurological:  Negative for weakness and headaches.  Hematological:  Negative for adenopathy.     Physical Exam Triage Vital Signs ED Triage Vitals   Encounter Vitals Group     BP 11/19/23 1009 102/78     Systolic BP Percentile --      Diastolic BP Percentile --      Pulse Rate 11/19/23 1009 100     Resp 11/19/23 1009 19     Temp 11/19/23 1009 98.2 F (36.8 C)     Temp Source 11/19/23 1009 Oral     SpO2 11/19/23 1009 100 %     Weight --      Height --      Head Circumference --      Peak Flow --      Pain Score 11/19/23 1008 7     Pain Loc --      Pain Education --      Exclude from Growth Chart --    No data found.  Updated Vital Signs BP 102/78 (BP Location: Left Arm)   Pulse 100   Temp 98.2 F (36.8 C) (Oral)   Resp 19   LMP 10/30/2023 (Exact Date)   SpO2 100%   Physical Exam Vitals and nursing note reviewed.  Constitutional:      General: She is not in acute distress.    Appearance: Normal appearance. She is ill-appearing. She is not toxic-appearing.  HENT:     Head: Normocephalic and atraumatic.     Nose: Nose normal.     Mouth/Throat:     Mouth: Mucous membranes are moist.     Pharynx: Oropharynx is clear.  Eyes:     General: No scleral icterus.       Right eye: No discharge.        Left eye: No discharge.     Conjunctiva/sclera: Conjunctivae normal.  Cardiovascular:     Rate and Rhythm: Normal rate and regular rhythm.     Heart sounds: Normal heart sounds.  Pulmonary:     Effort: Pulmonary effort is normal. No respiratory distress.     Breath sounds: Normal breath sounds.  Abdominal:     Palpations: Abdomen is soft.     Tenderness: There is abdominal tenderness (LUQ). There is no guarding or rebound.  Musculoskeletal:     Cervical back: Neck supple.  Skin:    General: Skin is dry.  Neurological:     General: No focal deficit present.     Mental Status: She is alert. Mental status is at baseline.     Motor: No weakness.     Gait: Gait normal.  Psychiatric:        Mood and Affect: Mood normal.        Behavior: Behavior normal.  UC Treatments / Results  Labs (all labs ordered are  listed, but only abnormal results are displayed) Labs Reviewed - No data to display  EKG   Radiology No results found.  Procedures Procedures (including critical care time)  Medications Ordered in UC Medications - No data to display  Initial Impression / Assessment and Plan / UC Course  I have reviewed the triage vital signs and the nursing notes.  Pertinent labs & imaging results that were available during my care of the patient were reviewed by me and considered in my medical decision making (see chart for details).   28 year old female presents for generalized bodyaches, fatigue, abdominal cramping, nausea/vomiting and diarrhea that began last night/this morning.  Denies fever, urinary symptoms or upper respiratory complaints.  No chest pain or breathing problems.  Vitals are all stable and normal.  She is ill-appearing but nontoxic.  Appears fatigued.  Normal HEENT exam.  Abdomen soft with tenderness of the left upper quadrant.  No guarding or rebound.  Chest clear.  Heart regular rate and rhythm.  Discussed with patient that her symptoms are consistent with viral gastroenteritis.  Supportive care encouraged with increased rest and fluids.  Sent Zofran to pharmacy.  Also encouraged use of Pepto-Bismol, Tylenol, antidiarrheal medications if needed.  Thoroughly reviewed return and ER precautions.  Work note given.   Final Clinical Impressions(s) / UC Diagnoses   Final diagnoses:  Viral gastroenteritis  Nausea vomiting and diarrhea     Discharge Instructions      ABDOMINAL PAIN: You may take Tylenol for pain relief. Use medications as directed including antiemetics and antidiarrheal medications if suggested or prescribed. You should increase fluids and electrolytes as well as rest over these next several days. If you have any questions or concerns, or if your symptoms are not improving or if especially if they acutely worsen, please call or stop back to the clinic immediately  and we will be happy to help you or go to the ER   ABDOMINAL PAIN RED FLAGS: Seek immediate further care if: symptoms remain the same or worsen over the next 3-7 days, you are unable to keep fluids down, you see blood or mucus in your stool, you vomit black or dark red material, you have a fever of 101.F or higher, you have localized and/or persistent abdominal pain      ED Prescriptions     Medication Sig Dispense Auth. Provider   ondansetron (ZOFRAN-ODT) 4 MG disintegrating tablet Take 1 tablet (4 mg total) by mouth every 6 (six) hours as needed for nausea or vomiting. 15 tablet Gareth Morgan      PDMP not reviewed this encounter.   Shirlee Latch, PA-C 11/19/23 1045

## 2023-11-19 NOTE — Discharge Instructions (Signed)

## 2024-01-21 ENCOUNTER — Ambulatory Visit
Admission: EM | Admit: 2024-01-21 | Discharge: 2024-01-21 | Disposition: A | Discharge status: Home / Self Care | Payer: Self-pay | Attending: Emergency Medicine | Admitting: Emergency Medicine

## 2024-01-21 DIAGNOSIS — M7631 Iliotibial band syndrome, right leg: Secondary | ICD-10-CM

## 2024-01-21 MED ORDER — IBUPROFEN 600 MG PO TABS: 600.0000 mg | ORAL_TABLET | Freq: Four times a day (QID) | ORAL | 0 refills | Status: AC

## 2024-01-21 NOTE — ED Provider Notes (Signed)
 [facility]    CSN: [id number] Arrival date & time: 01/21/24  1534      History   Chief Complaint Chief Complaint  Patient presents with   Leg Pain    HPI Nancy Brown is a 28 y.o. female.   HPI  28 year old female with past medical history significant for anxiety with panic attacks, hidradenitis suppurativa, and ANA positive presents for evaluation of right hip pain.  She reports that the pain began approximately 2 weeks ago in her low back but then spread to her right hip.  The pain extends from her hip down to the level of her knee.  The pain persist whether at rest or with activity.  Past Medical History:  Diagnosis Date   Anxiety    w/ panic attacks   Hidradenitis axillaris    surgically removed in 2016.   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   Post-term pregnancy, 40-42 weeks of gestation 05/17/2021   Pelvic pressure in pregnancy, antepartum, third trimester 03/16/2021   Pregnancy 03/14/2021   COVID-19 affecting pregnancy, antepartum 10/07/2020   Supervision of normal first pregnancy 09/29/2020   False-positive serological test result 11/04/2015   ANA positive 10/21/2015   Hand discomfort 10/21/2015   Disruption of wound 12/16/2014    Past Surgical History:  Procedure Laterality Date   AXILLARY HIDRADENITIS EXCISION Bilateral 12-04-14   CYST EXCISION Bilateral    side of ears    OB History     Gravida  2   Para  1   Term  1   Preterm      AB  1   Living  1      SAB      IAB      Ectopic      Multiple  0   Live Births  1            Home Medications    Prior to Admission medications   Medication Sig Start Date End Date Taking? Authorizing Provider  ibuprofen (ADVIL) 600 MG tablet Take 1 tablet (600 mg total) by mouth every 6 (six) hours. 01/21/24   Becky Augusta, NP  levalbuterol  [facility] HFA) 45 MCG/ACT inhaler Inhale 1-2 puffs into the lungs every 4 (four) hours as needed for wheezing or shortness of breath. 03/24/21   Hildred Laser, MD  norelgestromin-ethinyl estradiol [facility]) 150-35 MCG/24HR transdermal patch Place 1 patch onto the skin once a week. 07/19/21   Hildred Laser, MD  ondansetron (ZOFRAN-ODT) 4 MG disintegrating tablet Take 1 tablet (4 mg total) by mouth every 6 (six) hours as needed for nausea or vomiting. 11/19/23   Shirlee Latch, PA-C  Prenatal Vit-Fe Fumarate-FA (PRENATAL VITAMIN PO) Take 1 tablet by mouth daily.    [provider]  sertraline (ZOLOFT) 50 MG tablet Take 1 tablet (50 mg total) by mouth daily. 07/19/21   Hildred Laser, MD    Family History Family History  Problem Relation Age of Onset   Hypertension Mother    Asthma Mother    Thyroid disease Mother    Hyperlipidemia Mother    Other Father        "gum disease"    Social History Social History   Tobacco Use   Smoking status: Never   Smokeless tobacco: Never  Vaping Use   Vaping status: Never Used  Substance Use Topics   Alcohol use: No   Drug use: No     Allergies   Other, Stadol [butorphanol], Mango flavoring agent (non-screening), and Yodora deodorant   Review of Systems Review of Systems  Musculoskeletal:  Positive for myalgias.     Physical Exam Triage Vital Signs ED Triage Vitals  Encounter Vitals Group     BP      Systolic BP Percentile      Diastolic BP Percentile      Pulse      Resp      Temp      Temp src      SpO2      Weight      Height      Head Circumference      Peak Flow      Pain Score      Pain Loc      Pain Education      Exclude from Growth Chart    No data found.  Updated Vital Signs BP 124/72 (BP Location: Right Arm)   Pulse 74   Temp 98 F (36.7 C) (Oral)   Resp 15   LMP 01/16/2024 (Approximate)   SpO2 96%   Visual Acuity Right Eye Distance:   Left Eye Distance:   Bilateral Distance:    Right Eye Near:    Left Eye Near:    Bilateral Near:     Physical Exam Vitals and nursing note reviewed.  Constitutional:      Appearance: Normal appearance. She is not ill-appearing.  HENT:     Head: Normocephalic and atraumatic.  Musculoskeletal:        General: Tenderness present. No signs of injury.  Skin:    General: Skin is warm and dry.     Capillary Refill: Capillary refill takes less than 2 seconds.  Neurological:     General: No focal deficit present.     Mental Status: She is alert and oriented to person, place, and time.      UC Treatments / Results  Labs (all labs ordered are listed, but only abnormal results are displayed) Labs Reviewed - No data to display  EKG   Radiology No results found.  Procedures Procedures (including critical care time)  Medications Ordered in UC Medications - No data to display  Initial Impression / Assessment and Plan / UC Course  I have reviewed the triage vital signs and the nursing notes.  Pertinent labs & imaging results that were available during my care of the patient were reviewed by me and considered in my medical decision making (see chart for details).   Patient is a pleasant, nontoxic-appearing 28 year old female presenting for evaluation of pain in her right hip as outlined HPI above.  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  When her symptoms began they initially were in her low back and then localized to the right hip.  Where she is currently having pain is along the path of her IT band.  She has no midline process tenderness or step-off in her lumbar spine.  Also no tenderness to palpation of the bilateral lumbar paraspinous region.  No pain with palpation of the musculature in her right buttock.  She does have pain with palpation of the entire length of her IT band.  I will discharge patient with a diagnosis of IT band syndrome and give her home physical therapy exercises to help loosen the IT band and  aid in  pain relief.  I will also prescribe 6 and milligrams of ibuprofen that she can take every 6 hours as needed for pain.  Return precautions reviewed.  Work note provided.   Final Clinical Impressions(s) / UC Diagnoses   Final diagnoses:  It band syndrome, right     Discharge Instructions      600 mg of ibuprofen every 6 hours as needed decrease pain and inflammation in your IT band.  You may apply ice, or moist heat, to your IT band for 20 minutes at a time, 2-3 times a day, as needed for pain and inflammation.  Follow the physical therapy exercises given in your discharge instructions.  If you have any new or worsening symptoms other return for reevaluation or follow-up with your primary care provider.     ED Prescriptions     Medication Sig Dispense Auth. Provider   ibuprofen (ADVIL) 600 MG tablet Take 1 tablet (600 mg total) by mouth every 6 (six) hours. 30 tablet Becky Augusta, NP      PDMP not reviewed this encounter.   Becky Augusta, NP 01/21/24 [phone]

## 2024-01-21 NOTE — ED Triage Notes (Signed)
 Patient states that she has pain from her hip to her knee on the right side. Feels like its pulsating.

## 2024-01-21 NOTE — Discharge Instructions (Addendum)
 600 mg of ibuprofen every 6 hours as needed decrease pain and inflammation in your IT band.  You may apply ice, or moist heat, to your IT band for 20 minutes at a time, 2-3 times a day, as needed for pain and inflammation.  Follow the physical therapy exercises given in your discharge instructions.  If you have any new or worsening symptoms other return for reevaluation or follow-up with your primary care provider.
